# Patient Record
Sex: Female | Born: 1985 | Race: White | Hispanic: No | Marital: Married | Smoking: Never smoker
Health system: Southern US, Community
[De-identification: ages and names within clinical notes are randomized; demographics above are authoritative.]

## PROBLEM LIST (undated history)

## (undated) DIAGNOSIS — K5900 Constipation, unspecified: Secondary | ICD-10-CM

## (undated) DIAGNOSIS — G939 Disorder of brain, unspecified: Secondary | ICD-10-CM

## (undated) DIAGNOSIS — F419 Anxiety disorder, unspecified: Secondary | ICD-10-CM

## (undated) HISTORY — DX: Disorder of brain, unspecified: G93.9

## (undated) HISTORY — DX: Anxiety disorder, unspecified: F41.9

## (undated) HISTORY — DX: Constipation, unspecified: K59.00

---
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
## 2016-06-19 DIAGNOSIS — K5900 Constipation, unspecified: Secondary | ICD-10-CM | POA: Diagnosis not present

## 2016-07-25 ENCOUNTER — Ambulatory Visit (INDEPENDENT_AMBULATORY_CARE_PROVIDER_SITE_OTHER): Payer: BLUE CROSS/BLUE SHIELD | Admitting: Obstetrics and Gynecology

## 2016-07-25 ENCOUNTER — Encounter: Payer: Self-pay | Admitting: Obstetrics and Gynecology

## 2016-07-25 VITALS — BP 121/71 | HR 68 | Ht 63.0 in | Wt 143.6 lb

## 2016-07-25 DIAGNOSIS — F419 Anxiety disorder, unspecified: Secondary | ICD-10-CM | POA: Diagnosis not present

## 2016-07-25 DIAGNOSIS — Z8742 Personal history of other diseases of the female genital tract: Secondary | ICD-10-CM | POA: Diagnosis not present

## 2016-07-25 DIAGNOSIS — N943 Premenstrual tension syndrome: Secondary | ICD-10-CM | POA: Diagnosis not present

## 2016-07-25 DIAGNOSIS — Z01419 Encounter for gynecological examination (general) (routine) without abnormal findings: Secondary | ICD-10-CM | POA: Diagnosis not present

## 2016-07-25 MED ORDER — NORETHINDRONE-ETH ESTRADIOL 1-35 MG-MCG PO TABS: 1.0000 | ORAL_TABLET | Freq: Every day | ORAL | 4 refills | Status: DC

## 2016-07-25 NOTE — Patient Instructions (Signed)
1. Pap smear is done 2. Self breast awareness is encouraged 3. Healthy eating with exercise is encouraged 4. Birth control pill prescription is refilled 5. Continue with Wellbutrin for PMS 6. Return in 1 year for annual exam   Premenstrual Syndrome Premenstrual syndrome (PMS) is a condition that consists of physical, emotional, and behavioral symptoms that affect women of childbearing age. PMS occurs 5-14 days before the start of a menstrual period and often recurs in a predictable pattern. The symptoms go away a few days after the menstrual period starts. PMS can interfere in many ways with normal daily activities and can range from mild to severe. When PMS is considered severe, it may be diagnosed as premenstrual dysphoric disorder (PMDD). A small percentage of women are affected by PMS symptoms and an even smaller percentage of those women are affected by PMDD.  CAUSES  The exact cause of PMS is unknown, but it seems to be related to cyclic hormone changes that happen before menstruation. These hormones are thought to affect chemicals in the brain (serotonin) that can influence a person's mood.  SYMPTOMS  Symptoms of PMS recur consistently from month to month and go away completely after the menstrual period starts. The most common emotional or behavioral symptom is mood swings. These mood swings can be disabling and interfere with normal activities of daily living. Other common symptoms include depression and angry outbursts. Other symptoms may include:   Irritability.  Anxiety.  Crying spells.   Food cravings or appetite changes.   Changes in sexual desire.   Confusion.   Aggression.   Social withdrawal.   Poor concentration. The most common physical symptoms include a sense of bloating, breast pain, headaches, and extreme fatigue. Other physical symptoms include:   Backaches.   Swelling of the hands and feet.   Weight gain.   Hot flashes.  DIAGNOSIS  To make  a diagnosis, your caregiver will ask questions to confirm that you are having a pattern of symptoms. Symptoms must:   Be present 5 days before the start of your period and be present at least 3 months in a row.   End within 4 days after your period starts.   Interfere with some of your normal activities.  Other conditions, such as thyroid disease, depression, and migraine headaches must be ruled out before a diagnosis of PMS is confirmed.  TREATMENT  Your caregiver may suggest ways to maintain a healthy lifestyle, such as exercise. Over-the-counter pain relievers may ease cramps, aches, pains, headaches, and breast tenderness. However, selective serotonin reuptake inhibitors (SSRIs) are medicines that are most beneficial in improving PMS if taken in the second half of the monthly cycle. They may be taken on a daily basis. The most effective oral contraceptive pill used for symptoms of PMS is one that contains the ingredient drospirenone. Taking 4 days off of the pill instead of the usual 7 days also has shown to increase effectiveness.  There are a number of drugs, dietary supplements, vitamins, and water pills (diuretics) which have been suggested to be helpful but have not shown to be of any benefit to improving PMS symptoms.  HOME CARE INSTRUCTIONS   For 2-3 months, write down your symptoms, their severity, and how long they last. This may help your caregiver prescribe the best treatment for your symptoms.  Exercise regularly as suggested by your caregiver.  Eat a regular, well-balanced diet.  Avoid caffeine, alcohol, and tobacco consumption.  Limit salt and salty foods to lessen bloating  and fluid retention.  Get enough sleep. Practice relaxation techniques.  Drink enough fluids to keep your urine clear or pale yellow.  Take medicines as directed by your caregiver.  Limit stress.  Take a multivitamin as directed by your caregiver. This information is not intended to replace  advice given to you by your health care provider. Make sure you discuss any questions you have with your health care provider. Document Released: 07/28/2000 Document Revised: 04/24/2012 Document Reviewed: 04/30/2015 Elsevier Interactive Patient Education  2017 [employer] Maintenance, Female Introduction Adopting a healthy lifestyle and getting preventive care can go a long way to promote health and wellness. Talk with your health care provider about what schedule of regular examinations is right for you. This is a good chance for you to check in with your provider about disease prevention and staying healthy. In between checkups, there are plenty of things you can do on your own. Experts have done a lot of research about which lifestyle changes and preventive measures are most likely to keep you healthy. Ask your health care provider for more information. Weight and diet Eat a healthy diet  Be sure to include plenty of vegetables, fruits, low-fat dairy products, and lean protein.  Do not eat a lot of foods high in solid fats, added sugars, or salt.  Get regular exercise. This is one of the most important things you can do for your health.  Most adults should exercise for at least 150 minutes each week. The exercise should increase your heart rate and make you sweat (moderate-intensity exercise).  Most adults should also do strengthening exercises at least twice a week. This is in addition to the moderate-intensity exercise. Maintain a healthy weight  Body mass index (BMI) is a measurement that can be used to identify possible weight problems. It estimates body fat based on height and weight. Your health care provider can help determine your BMI and help you achieve or maintain a healthy weight.  For females 38 years of age and older:  A BMI below 18.5 is considered underweight.  A BMI of 18.5 to 24.9 is normal.  A BMI of 25 to 29.9 is considered overweight.  A BMI of 30 and  above is considered obese. Watch levels of cholesterol and blood lipids  You should start having your blood tested for lipids and cholesterol at 31 years of age, then have this test every 5 years.  You may need to have your cholesterol levels checked more often if:  Your lipid or cholesterol levels are high.  You are older than 31 years of age.  You are at high risk for heart disease. Cancer screening Lung Cancer  Lung cancer screening is recommended for adults 75-74 years old who are at high risk for lung cancer because of a history of smoking.  A yearly low-dose CT scan of the lungs is recommended for people who:  Currently smoke.  Have quit within the past 15 years.  Have at least a 30-pack-year history of smoking. A pack year is smoking an average of one pack of cigarettes a day for 1 year.  Yearly screening should continue until it has been 15 years since you quit.  Yearly screening should stop if you develop a health problem that would prevent you from having lung cancer treatment. Breast Cancer  Practice breast self-awareness. This means understanding how your breasts normally appear and feel.  It also means doing regular breast self-exams. Let your health care provider know  about any changes, no matter how small.  If you are in your 20s or 30s, you should have a clinical breast exam (CBE) by a health care provider every 1-3 years as part of a regular health exam.  If you are 29 or older, have a CBE every year. Also consider having a breast X-ray (mammogram) every year.  If you have a family history of breast cancer, talk to your health care provider about genetic screening.  If you are at high risk for breast cancer, talk to your health care provider about having an MRI and a mammogram every year.  Breast cancer gene (BRCA) assessment is recommended for women who have family members with BRCA-related cancers. BRCA-related cancers  include:  Breast.  Ovarian.  Tubal.  Peritoneal cancers.  Results of the assessment will determine the need for genetic counseling and BRCA1 and BRCA2 testing. Cervical Cancer  Your health care provider may recommend that you be screened regularly for cancer of the pelvic organs (ovaries, uterus, and vagina). This screening involves a pelvic examination, including checking for microscopic changes to the surface of your cervix (Pap test). You may be encouraged to have this screening done every 3 years, beginning at age 9.  For women ages 40-65, health care providers may recommend pelvic exams and Pap testing every 3 years, or they may recommend the Pap and pelvic exam, combined with testing for human papilloma virus (HPV), every 5 years. Some types of HPV increase your risk of cervical cancer. Testing for HPV may also be done on women of any age with unclear Pap test results.  Other health care providers may not recommend any screening for nonpregnant women who are considered low risk for pelvic cancer and who do not have symptoms. Ask your health care provider if a screening pelvic exam is right for you.  If you have had past treatment for cervical cancer or a condition that could lead to cancer, you need Pap tests and screening for cancer for at least 20 years after your treatment. If Pap tests have been discontinued, your risk factors (such as having a new sexual partner) need to be reassessed to determine if screening should resume. Some women have medical problems that increase the chance of getting cervical cancer. In these cases, your health care provider may recommend more frequent screening and Pap tests. Colorectal Cancer  This type of cancer can be detected and often prevented.  Routine colorectal cancer screening usually begins at 31 years of age and continues through 31 years of age.  Your health care provider may recommend screening at an earlier age if you have risk factors  for colon cancer.  Your health care provider may also recommend using home test kits to check for hidden blood in the stool.  A small camera at the end of a tube can be used to examine your colon directly (sigmoidoscopy or colonoscopy). This is done to check for the earliest forms of colorectal cancer.  Routine screening usually begins at age 66.  Direct examination of the colon should be repeated every 5-10 years through 31 years of age. However, you may need to be screened more often if early forms of precancerous polyps or small growths are found. Skin Cancer  Check your skin from head to toe regularly.  Tell your health care provider about any new moles or changes in moles, especially if there is a change in a mole's shape or color.  Also tell your health care provider if  you have a mole that is larger than the size of a pencil eraser.  Always use sunscreen. Apply sunscreen liberally and repeatedly throughout the day.  Protect yourself by wearing long sleeves, pants, a wide-brimmed hat, and sunglasses whenever you are outside. Heart disease, diabetes, and high blood pressure  High blood pressure causes heart disease and increases the risk of stroke. High blood pressure is more likely to develop in:  People who have blood pressure in the high end of the normal range (130-139/85-89 mm Hg).  People who are overweight or obese.  People who are African American.  If you are 82-71 years of age, have your blood pressure checked every 3-5 years. If you are 33 years of age or older, have your blood pressure checked every year. You should have your blood pressure measured twice-once when you are at a hospital or clinic, and once when you are not at a hospital or clinic. Record the average of the two measurements. To check your blood pressure when you are not at a hospital or clinic, you can use:  An automated blood pressure machine at a pharmacy.  A home blood pressure monitor.  If you  are between 58 years and 41 years old, ask your health care provider if you should take aspirin to prevent strokes.  Have regular diabetes screenings. This involves taking a blood sample to check your fasting blood sugar level.  If you are at a normal weight and have a low risk for diabetes, have this test once every three years after 31 years of age.  If you are overweight and have a high risk for diabetes, consider being tested at a younger age or more often. Preventing infection Hepatitis B  If you have a higher risk for hepatitis B, you should be screened for this virus. You are considered at high risk for hepatitis B if:  You were born in a country where hepatitis B is common. Ask your health care provider which countries are considered high risk.  Your parents were born in a high-risk country, and you have not been immunized against hepatitis B (hepatitis B vaccine).  You have HIV or AIDS.  You use needles to inject street drugs.  You live with someone who has hepatitis B.  You have had sex with someone who has hepatitis B.  You get hemodialysis treatment.  You take certain medicines for conditions, including cancer, organ transplantation, and autoimmune conditions. Hepatitis C  Blood testing is recommended for:  Everyone born from [birth year] through 1966.  Anyone with known risk factors for hepatitis C. Sexually transmitted infections (STIs)  You should be screened for sexually transmitted infections (STIs) including gonorrhea and chlamydia if:  You are sexually active and are younger than 31 years of age.  You are older than 31 years of age and your health care provider tells you that you are at risk for this type of infection.  Your sexual activity has changed since you were last screened and you are at an increased risk for chlamydia or gonorrhea. Ask your health care provider if you are at risk.  If you do not have HIV, but are at risk, it may be recommended that you  take a prescription medicine daily to prevent HIV infection. This is called pre-exposure prophylaxis (PrEP). You are considered at risk if:  You are sexually active and do not regularly use condoms or know the HIV status of your partner(s).  You take drugs by injection.  You  are sexually active with a partner who has HIV. Talk with your health care provider about whether you are at high risk of being infected with HIV. If you choose to begin PrEP, you should first be tested for HIV. You should then be tested every 3 months for as long as you are taking PrEP. Pregnancy  If you are premenopausal and you may become pregnant, ask your health care provider about preconception counseling.  If you may become pregnant, take 400 to 800 micrograms (mcg) of folic acid every day.  If you want to prevent pregnancy, talk to your health care provider about birth control (contraception). Osteoporosis and menopause  Osteoporosis is a disease in which the bones lose minerals and strength with aging. This can result in serious bone fractures. Your risk for osteoporosis can be identified using a bone density scan.  If you are 26 years of age or older, or if you are at risk for osteoporosis and fractures, ask your health care provider if you should be screened.  Ask your health care provider whether you should take a calcium or vitamin D supplement to lower your risk for osteoporosis.  Menopause may have certain physical symptoms and risks.  Hormone replacement therapy may reduce some of these symptoms and risks. Talk to your health care provider about whether hormone replacement therapy is right for you. Follow these instructions at home:  Schedule regular health, dental, and eye exams.  Stay current with your immunizations.  Do not use any tobacco products including cigarettes, chewing tobacco, or electronic cigarettes.  If you are pregnant, do not drink alcohol.  If you are breastfeeding, limit  how much and how often you drink alcohol.  Limit alcohol intake to no more than 1 drink per day for nonpregnant women. One drink equals 12 ounces of beer, 5 ounces of wine, or 1 ounces of hard liquor.  Do not use street drugs.  Do not share needles.  Ask your health care provider for help if you need support or information about quitting drugs.  Tell your health care provider if you often feel depressed.  Tell your health care provider if you have ever been abused or do not feel safe at home. This information is not intended to replace advice given to you by your health care provider. Make sure you discuss any questions you have with your health care provider. Document Released: 02/13/2011 Document Revised: 01/06/2016 Document Reviewed: 05/04/2015  2017 Elsevier

## 2016-07-25 NOTE — Progress Notes (Signed)
ANNUAL PREVENTATIVE CARE GYN  ENCOUNTER NOTE  Subjective:       Allison Huynh is a 30 y.o. G0P0000 female here for a routine annual gynecologic exam.  Current complaints: 1.little to   No cycle on ocp-   pms- really bad; Currently on bupropion with good control symptoms Remote history of severe dysmenorrhea as a teen; controlled with OCPs; currently on a 35 g pill for regulation of abnormal uterine bleeding   Gynecologic History Patient's last menstrual period was 07/23/2016 (exact date). Contraception: OCP (estrogen/progesterone) Last Pap: 2016 wnl. Results were: normal Last mammogram: n/a.  Menarche-age 79 Intervals-monthly Duration-5 days Dysmenorrhea-history of severe dysmenorrhea managed with ibuprofen or Aleve; resolved with OCP therapy History of PMS  Obstetric History OB History  Gravida Para Term Preterm AB Living  0 0 0 0 0 0  SAB TAB Ectopic Multiple Live Births  0 0 0 0 0        Past Medical History:  Diagnosis Date  . Anxiety   . Constipation     Past Surgical History:  Procedure Laterality Date  . APPENDECTOMY  2005    No current outpatient prescriptions on file prior to visit.   No current facility-administered medications on file prior to visit.     Allergies  Allergen Reactions  . Polysporin [Bacitracin-Polymyxin B]     Social History   Social History  . Marital status: Married    Spouse name: N/A  . Number of children: N/A  . Years of education: N/A   Occupational History  . Not on file.   Social History Main Topics  . Smoking status: Not on file  . Smokeless tobacco: Not on file  . Alcohol use Not on file  . Drug use: Unknown  . Sexual activity: Not on file   Other Topics Concern  . Not on file   Social History Narrative  . No narrative on file    No family history on file.  The following portions of the patient's history were reviewed and updated as appropriate: allergies, current medications, past family history, past  medical history, past social history, past surgical history and problem list.  Review of Systems ROS Review of Systems - General ROS: negative for - chills, fatigue, fever, hot flashes, night sweats, weight gain or weight loss Psychological ROS: negative for - anxiety, decreased libido, depression, mood swings, physical abuse or sexual abuse Ophthalmic ROS: negative for - blurry vision, eye pain or loss of vision ENT ROS: negative for - headaches, hearing change, visual changes or vocal changes Allergy and Immunology ROS: negative for - hives, itchy/watery eyes or seasonal allergies Hematological and Lymphatic ROS: negative for - bleeding problems, bruising, swollen lymph nodes or weight loss Endocrine ROS: negative for - galactorrhea, hair pattern changes, hot flashes, malaise/lethargy, mood swings, palpitations, polydipsia/polyuria, skin changes, temperature intolerance or unexpected weight changes Breast ROS: negative for - new or changing breast lumps or nipple discharge Respiratory ROS: negative for - cough or shortness of breath Cardiovascular ROS: negative for - chest pain, irregular heartbeat, palpitations or shortness of breath Gastrointestinal ROS: no abdominal pain, change in bowel habits, or black or bloody stools Genito-Urinary ROS: no dysuria, trouble voiding, or hematuria Musculoskeletal ROS: negative for - joint pain or joint stiffness Neurological ROS: negative for - bowel and bladder control changes Dermatological ROS: negative for rash and skin lesion changes   Objective:   BP 121/71   Pulse 68   Ht 5\' 3"  (1.6 m)   Wt  143 lb 9.6 oz (65.1 kg)   LMP 07/23/2016 (Exact Date)   BMI 25.44 kg/m  CONSTITUTIONAL: Well-developed, well-nourished female in no acute distress.  PSYCHIATRIC: Normal mood and affect. Normal behavior. Normal judgment and thought content. NEUROLGIC: Alert and oriented to person, place, and time. Normal muscle tone coordination. No cranial nerve deficit  noted. HENT:  Normocephalic, atraumatic, External right and left ear normal. Oropharynx is clear and moist EYES: Conjunctivae and EOM are normal. Pupils are equal, round, and reactive to light. No scleral icterus.  NECK: Normal range of motion, supple, no masses.  Normal thyroid.  SKIN: Skin is warm and dry. No rash noted. Not diaphoretic. No erythema. No pallor. CARDIOVASCULAR: Normal heart rate noted, regular rhythm, no murmur. RESPIRATORY: Clear to auscultation bilaterally. Effort and breath sounds normal, no problems with respiration noted. BREASTS: Symmetric in size. No masses, skin changes, nipple drainage, or lymphadenopathy. ABDOMEN: Soft, normal bowel sounds, no distention noted.  No tenderness, rebound or guarding.  BLADDER: Normal PELVIC:  External Genitalia: Normal  BUS: Normal  Vagina: Normal  Cervix: Normal; no lesions; no cervical motion tenderness  Uterus: Normal; midplane, normal size and shape, mobile, nontender  Adnexa: Normal; nonpalpable and nontender  RV: External Exam NormaI  MUSCULOSKELETAL: Normal range of motion. No tenderness.  No cyanosis, clubbing, or edema.  2+ distal pulses. LYMPHATIC: No Axillary, Supraclavicular, or Inguinal Adenopathy.    Assessment:   Annual gynecologic examination 30 y.o. Contraception: OCP (estrogen/progesterone) Normal BMI History of dysmenorrhea PMS Plan:  Pap: Pap Co Test Mammogram: Not Indicated Stool Guaiac Testing:  Not Indicated Labs: declines Routine preventative health maintenance measures emphasized: Exercise/Diet/Weight control, Tobacco Warnings and Alcohol/Substance use risks Birth control pill prescription refilled Continue with bupropion for PMS Return to Clinic - 1 Year   Crystal BrookfieldMiller, CMA  Herold HarmsMartin A Brandon Wiechman, MD  Note: This dictation was prepared with Dragon dictation along with smaller phrase technology. Any transcriptional errors that result from this process are unintentional.

## 2016-07-26 DIAGNOSIS — Z01419 Encounter for gynecological examination (general) (routine) without abnormal findings: Secondary | ICD-10-CM | POA: Diagnosis not present

## 2016-07-29 LAB — PAP IG AND HPV HIGH-RISK
HPV, HIGH-RISK: NEGATIVE
PAP Smear Comment: 0

## 2016-07-30 DIAGNOSIS — F4320 Adjustment disorder, unspecified: Secondary | ICD-10-CM | POA: Diagnosis not present

## 2016-08-16 DIAGNOSIS — F4320 Adjustment disorder, unspecified: Secondary | ICD-10-CM | POA: Diagnosis not present

## 2016-09-30 DIAGNOSIS — B373 Candidiasis of vulva and vagina: Secondary | ICD-10-CM | POA: Diagnosis not present

## 2016-09-30 DIAGNOSIS — N3001 Acute cystitis with hematuria: Secondary | ICD-10-CM | POA: Diagnosis not present

## 2016-10-17 DIAGNOSIS — M9902 Segmental and somatic dysfunction of thoracic region: Secondary | ICD-10-CM | POA: Diagnosis not present

## 2016-10-17 DIAGNOSIS — M5408 Panniculitis affecting regions of neck and back, sacral and sacrococcygeal region: Secondary | ICD-10-CM | POA: Diagnosis not present

## 2016-10-17 DIAGNOSIS — M9901 Segmental and somatic dysfunction of cervical region: Secondary | ICD-10-CM | POA: Diagnosis not present

## 2016-10-17 DIAGNOSIS — M62838 Other muscle spasm: Secondary | ICD-10-CM | POA: Diagnosis not present

## 2016-10-18 ENCOUNTER — Other Ambulatory Visit: Payer: Self-pay | Admitting: Obstetrics and Gynecology

## 2016-11-03 DIAGNOSIS — F4322 Adjustment disorder with anxiety: Secondary | ICD-10-CM | POA: Diagnosis not present

## 2016-11-08 DIAGNOSIS — F4322 Adjustment disorder with anxiety: Secondary | ICD-10-CM | POA: Diagnosis not present

## 2016-11-10 DIAGNOSIS — F4322 Adjustment disorder with anxiety: Secondary | ICD-10-CM | POA: Diagnosis not present

## 2016-11-24 DIAGNOSIS — F4322 Adjustment disorder with anxiety: Secondary | ICD-10-CM | POA: Diagnosis not present

## 2016-11-29 DIAGNOSIS — F4322 Adjustment disorder with anxiety: Secondary | ICD-10-CM | POA: Diagnosis not present

## 2016-11-30 DIAGNOSIS — M9902 Segmental and somatic dysfunction of thoracic region: Secondary | ICD-10-CM | POA: Diagnosis not present

## 2016-11-30 DIAGNOSIS — M62838 Other muscle spasm: Secondary | ICD-10-CM | POA: Diagnosis not present

## 2016-11-30 DIAGNOSIS — M9901 Segmental and somatic dysfunction of cervical region: Secondary | ICD-10-CM | POA: Diagnosis not present

## 2016-11-30 DIAGNOSIS — M5408 Panniculitis affecting regions of neck and back, sacral and sacrococcygeal region: Secondary | ICD-10-CM | POA: Diagnosis not present

## 2016-12-08 DIAGNOSIS — F4322 Adjustment disorder with anxiety: Secondary | ICD-10-CM | POA: Diagnosis not present

## 2016-12-13 DIAGNOSIS — M5408 Panniculitis affecting regions of neck and back, sacral and sacrococcygeal region: Secondary | ICD-10-CM | POA: Diagnosis not present

## 2016-12-13 DIAGNOSIS — M62838 Other muscle spasm: Secondary | ICD-10-CM | POA: Diagnosis not present

## 2016-12-13 DIAGNOSIS — M9902 Segmental and somatic dysfunction of thoracic region: Secondary | ICD-10-CM | POA: Diagnosis not present

## 2016-12-13 DIAGNOSIS — M9901 Segmental and somatic dysfunction of cervical region: Secondary | ICD-10-CM | POA: Diagnosis not present

## 2017-02-02 DIAGNOSIS — M9901 Segmental and somatic dysfunction of cervical region: Secondary | ICD-10-CM | POA: Diagnosis not present

## 2017-02-02 DIAGNOSIS — M5408 Panniculitis affecting regions of neck and back, sacral and sacrococcygeal region: Secondary | ICD-10-CM | POA: Diagnosis not present

## 2017-02-02 DIAGNOSIS — M9902 Segmental and somatic dysfunction of thoracic region: Secondary | ICD-10-CM | POA: Diagnosis not present

## 2017-02-02 DIAGNOSIS — M62838 Other muscle spasm: Secondary | ICD-10-CM | POA: Diagnosis not present

## 2017-03-20 DIAGNOSIS — M9901 Segmental and somatic dysfunction of cervical region: Secondary | ICD-10-CM | POA: Diagnosis not present

## 2017-03-20 DIAGNOSIS — M5408 Panniculitis affecting regions of neck and back, sacral and sacrococcygeal region: Secondary | ICD-10-CM | POA: Diagnosis not present

## 2017-03-20 DIAGNOSIS — M62838 Other muscle spasm: Secondary | ICD-10-CM | POA: Diagnosis not present

## 2017-03-20 DIAGNOSIS — G4489 Other headache syndrome: Secondary | ICD-10-CM | POA: Diagnosis not present

## 2017-04-27 ENCOUNTER — Ambulatory Visit: Payer: BLUE CROSS/BLUE SHIELD | Admitting: Family Medicine

## 2017-05-04 ENCOUNTER — Ambulatory Visit (INDEPENDENT_AMBULATORY_CARE_PROVIDER_SITE_OTHER): Payer: BLUE CROSS/BLUE SHIELD | Admitting: Family Medicine

## 2017-05-04 ENCOUNTER — Encounter: Payer: Self-pay | Admitting: Family Medicine

## 2017-05-04 VITALS — BP 102/60 | HR 60 | Temp 97.6°F | Resp 16 | Ht 63.0 in | Wt 149.0 lb

## 2017-05-04 DIAGNOSIS — Z7689 Persons encountering health services in other specified circumstances: Secondary | ICD-10-CM

## 2017-05-04 DIAGNOSIS — F419 Anxiety disorder, unspecified: Secondary | ICD-10-CM | POA: Diagnosis not present

## 2017-05-04 DIAGNOSIS — Z808 Family history of malignant neoplasm of other organs or systems: Secondary | ICD-10-CM | POA: Diagnosis not present

## 2017-05-04 DIAGNOSIS — K5909 Other constipation: Secondary | ICD-10-CM | POA: Insufficient documentation

## 2017-05-04 DIAGNOSIS — R1031 Right lower quadrant pain: Secondary | ICD-10-CM | POA: Diagnosis not present

## 2017-05-04 DIAGNOSIS — Z0000 Encounter for general adult medical examination without abnormal findings: Secondary | ICD-10-CM | POA: Diagnosis not present

## 2017-05-04 DIAGNOSIS — G939 Disorder of brain, unspecified: Secondary | ICD-10-CM

## 2017-05-04 DIAGNOSIS — Z114 Encounter for screening for human immunodeficiency virus [HIV]: Secondary | ICD-10-CM | POA: Diagnosis not present

## 2017-05-04 DIAGNOSIS — N912 Amenorrhea, unspecified: Secondary | ICD-10-CM | POA: Diagnosis not present

## 2017-05-04 LAB — CBC WITH DIFFERENTIAL/PLATELET
BASOS ABS: 88 {cells}/uL (ref 0–200)
Basophils Relative: 1.4 %
EOS ABS: 69 {cells}/uL (ref 15–500)
EOS PCT: 1.1 %
HEMATOCRIT: 38.8 % (ref 35.0–45.0)
HEMOGLOBIN: 13.2 g/dL (ref 11.7–15.5)
LYMPHS ABS: 2577 {cells}/uL (ref 850–3900)
MCH: 30.4 pg (ref 27.0–33.0)
MCHC: 34 g/dL (ref 32.0–36.0)
MCV: 89.4 fL (ref 80.0–100.0)
MPV: 10.3 fL (ref 7.5–12.5)
Monocytes Relative: 7.1 %
NEUTROS ABS: 3119 {cells}/uL (ref 1500–7800)
Neutrophils Relative %: 49.5 %
Platelets: 292 10*3/uL (ref 140–400)
RBC: 4.34 10*6/uL (ref 3.80–5.10)
RDW: 11.1 % (ref 11.0–15.0)
Total Lymphocyte: 40.9 %
WBC: 6.3 10*3/uL (ref 3.8–10.8)
WBCMIX: 447 {cells}/uL (ref 200–950)

## 2017-05-04 LAB — COMPLETE METABOLIC PANEL WITH GFR
AG Ratio: 1.2 (calc) (ref 1.0–2.5)
ALBUMIN MSPROF: 3.9 g/dL (ref 3.6–5.1)
ALT: 28 U/L (ref 6–29)
AST: 22 U/L (ref 10–30)
Alkaline phosphatase (APISO): 48 U/L (ref 33–115)
BILIRUBIN TOTAL: 0.4 mg/dL (ref 0.2–1.2)
BUN: 10 mg/dL (ref 7–25)
CHLORIDE: 105 mmol/L (ref 98–110)
CO2: 26 mmol/L (ref 20–32)
CREATININE: 0.78 mg/dL (ref 0.50–1.10)
Calcium: 8.6 mg/dL (ref 8.6–10.2)
GFR, EST AFRICAN AMERICAN: 117 mL/min/{1.73_m2} (ref >=60)
GFR, Est Non African American: 101 mL/min/{1.73_m2} (ref >=60)
GLUCOSE: 89 mg/dL (ref 65–99)
Globulin: 3.2 g/dL (calc) (ref 1.9–3.7)
Potassium: 4.1 mmol/L (ref 3.5–5.3)
Sodium: 137 mmol/L (ref 135–146)
TOTAL PROTEIN: 7.1 g/dL (ref 6.1–8.1)

## 2017-05-04 LAB — TSH: TSH: 0.84 mIU/L

## 2017-05-04 NOTE — Assessment & Plan Note (Signed)
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

## 2017-05-04 NOTE — Assessment & Plan Note (Signed)
Well controlled Followed by GI Continue current meds

## 2017-05-04 NOTE — Progress Notes (Signed)
Patient: Allison Huynh, Female    DOB: 1985-09-12, 31 y.o.   MRN: [id number] Visit Date: 05/04/2017  Today's Provider: Shirlee Latch, MD   Chief Complaint  Patient presents with  . Establish Care  . Annual Exam   Subjective:    Annual physical exam Allison Huynh is a 31 y.o. female who presents today for health maintenance and complete physical. She feels well. She reports exercising typically 4 days per week for 1 hour. She bikes, runs, swims. Admits this has been decreased lately. She reports she is sleeping fairly well. She states she wakes up during the night about 2-3 times per night. No difficulty falling back asleep. She states she takes Melatonin, with relief.   She sees [facility] for gynecology. Last pap was 07/26/2016; was NIL and HPV negative. [place] the [place]. She states her last Tdap was May, 2017. Declines her flu vaccine.  She states she has RLQ pain yesterday now resolved. GYN informed her this may be a hernia. She is also c/o increased urinary urgency in the last 2-3 months. LMP was 03/29/2017. She states her menses are fairly regular on OCP's with a 4 week cycle. She is not concerned about pregnancy. Usually has periods once monthly on OCP. Periods were irregular before starting OCPs many years ago.  Has h/o ovarian cysts. -----------------------------------------------------------------  Had episode of Bell's Palsy - got MRI and lesions were seen. No bands on LP.  Was seen by MS specialist and told no medications needed right now. Also told that she may develop MS in the future.  Reports episodic numbness of R side of cranium in high stress times.  Chronic constipation: f/b GI.  Taking amitiza, colace and miralax.  Feels well controlled currently.  Most recent colonoscopy normal.  Has fam hx UC.  Review of Systems  Constitutional: Negative.   HENT: Negative.   Eyes: Negative.   Respiratory: Negative.     Cardiovascular: Negative.   Gastrointestinal: Positive for constipation (F/B GI). Negative for abdominal distention, abdominal pain, anal bleeding, blood in stool, diarrhea, nausea, rectal pain and vomiting.  Endocrine: Negative.   Genitourinary: Positive for urgency. Negative for decreased urine volume, difficulty urinating, dyspareunia, dysuria, enuresis, flank pain, frequency, genital sores, hematuria, menstrual problem, pelvic pain, vaginal bleeding, vaginal discharge and vaginal pain.  Musculoskeletal: Negative.   Skin: Negative.   Allergic/Immunologic: Negative.   Neurological: Negative.   Hematological: Negative.   Psychiatric/Behavioral: Negative.     Social History      She  reports that she has never smoked. She has never used smokeless tobacco. She reports that she drinks alcohol. She reports that she does not use drugs.       Social History   Social History  . Marital status: Married    Spouse name: Selena Batten  . Number of children: 0  . Years of education: Master's   Occupational History  . data analyst [employer]   Social History Main Topics  . Smoking status: Never Smoker  . Smokeless tobacco: Never Used  . Alcohol use Yes     Comment: 3 q week- beer/wine  . Drug use: No  . Sexual activity: Yes    Partners: Male    Birth control/ protection: Pill   Other Topics Concern  . None   Social History Narrative  . None    Past Medical History:  Diagnosis Date  . Anxiety   . Brain lesion    ?possible early  MS  . Constipation      Patient Active Problem List   Diagnosis Date Noted  . Chronic constipation 05/04/2017  . 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 Anxiety 07/25/2016  . History of dysmenorrhea 07/25/2016  . PMS (premenstrual syndrome) 07/25/2016    Past Surgical History:  Procedure Laterality Date  . LAPAROSCOPIC APPENDECTOMY  2005    Family History        Family Status  Relation Status  . Mother Alive  . Father Alive  . Sister Alive  . PGF (Not  Specified)  . Neg Hx (Not Specified)        Her family history includes Bladder Cancer (age of onset: 62) in her paternal grandfather; Diabetes in her father; GER disease in her mother; Healthy in her sister; Ulcerative colitis (age of onset: 12) in her mother.     Allergies  Allergen Reactions  . Polysporin [Bacitracin-Polymyxin B]   . Codeine Rash     Current Outpatient Prescriptions:  .  AMITIZA 24 MCG capsule, , Disp: , Rfl:  .  busPIRone (BUSPAR) 7.5 MG tablet, Take 7.5 mg by mouth 2 (two) times daily., Disp: , Rfl: 2 .  docusate sodium (COLACE) 250 MG capsule, Take 500 mg by mouth daily., Disp: , Rfl:  .  loratadine (CLARITIN) 10 MG tablet, Take 10 mg by mouth daily., Disp: , Rfl:  .  Multiple Vitamin (MULTIVITAMIN) tablet, Take 1 tablet by mouth daily., Disp: , Rfl:  .  norethindrone-ethinyl estradiol 1/35 (PIRMELLA 1/35) tablet, Take 1 tablet by mouth daily., Disp: 3 Package, Rfl: 4 .  Omega-3 Fatty Acids (FISH OIL PO), Take by mouth., Disp: , Rfl:  .  polyethylene glycol powder (GLYCOLAX/MIRALAX) powder, Take 17 g by mouth daily., Disp: , Rfl:    Patient Care Team: Erasmo Downer, MD as PCP - General (Family Medicine)      Objective:   Vitals: BP 102/60 (BP Location: Left Arm, Patient Position: Sitting, Cuff Size: Normal)   Pulse 60   Temp 97.6 F (36.4 C) (Oral)   Resp 16   Ht  (1.6 m)   Wt 149 lb (67.6 kg)   LMP 03/29/2017   BMI 26.39 kg/m    Vitals:   05/04/17 1550  BP: 102/60  Pulse: 60  Resp: 16  Temp: 97.6 F (36.4 C)  TempSrc: Oral  Weight: 149 lb (67.6 kg)  Height:  (1.6 m)     Physical Exam  Constitutional: She is oriented to person, place, and time. She appears well-developed and well-nourished. No distress.  HENT:  Head: Normocephalic and atraumatic.  Right Ear: External ear normal.  Left Ear: External ear normal.  Nose: Nose normal.  Mouth/Throat: Oropharynx is clear and moist.  Eyes: Pupils are equal, round, and reactive  to light. Conjunctivae and EOM are normal. No scleral icterus.  Neck: Neck supple. No thyromegaly present.  Cardiovascular: Normal rate, regular rhythm, normal heart sounds and intact distal pulses.   No murmur heard. Pulmonary/Chest: Effort normal and breath sounds normal. No respiratory distress. She has no wheezes. She has no rales.  Abdominal: Soft. Bowel sounds are normal. She exhibits no distension. There is no tenderness. There is no rebound and no guarding.  Musculoskeletal: She exhibits no edema or deformity.  Lymphadenopathy:    She has no cervical adenopathy.  Neurological: She is alert and oriented to person, place, and time. No cranial nerve deficit.  Skin: Skin is warm and dry. No rash noted.  Psychiatric:  She has a normal mood and affect. Her behavior is normal.  Vitals reviewed.    Depression Screen PHQ 2/9 Scores 05/04/2017  PHQ - 2 Score 0     Assessment & Plan:     Routine Health Maintenance and Physical Exam  Exercise Activities and Dietary recommendations Goals    None       There is no immunization history on file for this patient.  Health Maintenance  Topic Date Due  . HIV Screening  07/11/2000  . TETANUS/TDAP  07/11/2004  . INFLUENZA VACCINE  11/11/2017 (Originally 03/14/2017)  . PAP SMEAR  07/27/2019     Discussed health benefits of physical activity, and encouraged her to engage in regular exercise appropriate for her age and condition.    -------------------------------------------------------------------- Problem List Items Addressed This Visit      Digestive   Chronic constipation    Well controlled Followed by GI Continue current meds      Relevant Medications   docusate sodium (COLACE) 250 MG capsule   polyethylene glycol powder (GLYCOLAX/MIRALAX) powder   Other Relevant Orders   TSH     Other   Anxiety    Well-controlled Continue BuSpar      Lesion of brain    odd reported history of brain lesions that were not  treated Episodic numbness Concern for possible MS Request records from previous neurologist and consider neurology referral      RLQ abdominal pain    Benign abdominal exam today Patient is status post appendectomy Could be related to her chronic constipation as it is intermittent in nature Could also be related to her history of ovarian cysts No hernia detected today Return precautions discussed Urine pregnancy negative      Family history of skin cancer    Referral to dermatology for annual skin exam given family history of skin cancer      Relevant Orders   Ambulatory referral to Dermatology   RESOLVED: Amenorrhea   Relevant Orders   TSH    Other Visit Diagnoses    Healthcare maintenance    -  Primary   Relevant Orders   CBC w/Diff/Platelet   Comprehensive metabolic panel   TSH   Ambulatory referral to Dermatology   Encounter to establish care       Screening for HIV (human immunodeficiency virus)       Relevant Orders   HIV antibody (with reflex)      Return in about 1 year (around 05/04/2018) for CPE.  The entirety of the information documented in the History of Present Illness, Review of Systems and Physical Exam were personally obtained by me. Portions of this information were initially documented by Irving Burton Ratchford, CMA and reviewed by me for thoroughness and accuracy.     Shirlee Latch, MD  [facility]

## 2017-05-04 NOTE — Assessment & Plan Note (Signed)
Well-controlled. Continue BuSpar. 

## 2017-05-04 NOTE — Assessment & Plan Note (Signed)
Referral to dermatology for annual skin exam given family history of skin cancer

## 2017-05-04 NOTE — Patient Instructions (Signed)
Preventive Care 18-39 Years, Female Preventive care refers to lifestyle choices and visits with your health care provider that can promote health and wellness. What does preventive care include?  A yearly physical exam. This is also called an annual well check.  Dental exams once or twice a year.  Routine eye exams. Ask your health care provider how often you should have your eyes checked.  Personal lifestyle choices, including: ? Daily care of your teeth and gums. ? Regular physical activity. ? Eating a healthy diet. ? Avoiding tobacco and drug use. ? Limiting alcohol use. ? Practicing safe sex. ? Taking vitamin and mineral supplements as recommended by your health care provider. What happens during an annual well check? The services and screenings done by your health care provider during your annual well check will depend on your age, overall health, lifestyle risk factors, and family history of disease. Counseling Your health care provider may ask you questions about your:  Alcohol use.  Tobacco use.  Drug use.  Emotional well-being.  Home and relationship well-being.  Sexual activity.  Eating habits.  Work and work Statistician.  Method of birth control.  Menstrual cycle.  Pregnancy history.  Screening You may have the following tests or measurements:  Height, weight, and BMI.  Diabetes screening. This is done by checking your blood sugar (glucose) after you have not eaten for a while (fasting).  Blood pressure.  Lipid and cholesterol levels. These may be checked every 5 years starting at age 66.  Skin check.  Hepatitis C blood test.  Hepatitis B blood test.  Sexually transmitted disease (STD) testing.  BRCA-related cancer screening. This may be done if you have a family history of breast, ovarian, tubal, or peritoneal cancers.  Pelvic exam and Pap test. This may be done every 3 years starting at age 40. Starting at age 59, this may be done every 5  years if you have a Pap test in combination with an HPV test.  Discuss your test results, treatment options, and if necessary, the need for more tests with your health care provider. Vaccines Your health care provider may recommend certain vaccines, such as:  Influenza vaccine. This is recommended every year.  Tetanus, diphtheria, and acellular pertussis (Tdap, Td) vaccine. You may need a Td booster every 10 years.  Varicella vaccine. You may need this if you have not been vaccinated.  HPV vaccine. If you are 69 or younger, you may need three doses over 6 months.  Measles, mumps, and rubella (MMR) vaccine. You may need at least one dose of MMR. You may also need a second dose.  Pneumococcal 13-valent conjugate (PCV13) vaccine. You may need this if you have certain conditions and were not previously vaccinated.  Pneumococcal polysaccharide (PPSV23) vaccine. You may need one or two doses if you smoke cigarettes or if you have certain conditions.  Meningococcal vaccine. One dose is recommended if you are age 27-21 years and a first-year college student living in a residence hall, or if you have one of several medical conditions. You may also need additional booster doses.  Hepatitis A vaccine. You may need this if you have certain conditions or if you travel or work in places where you may be exposed to hepatitis A.  Hepatitis B vaccine. You may need this if you have certain conditions or if you travel or work in places where you may be exposed to hepatitis B.  Haemophilus influenzae type b (Hib) vaccine. You may need this if  you have certain risk factors.  Talk to your health care provider about which screenings and vaccines you need and how often you need them. This information is not intended to replace advice given to you by your health care provider. Make sure you discuss any questions you have with your health care provider. Document Released: 09/26/2001 Document Revised: 04/19/2016  Document Reviewed: 06/01/2015 Elsevier Interactive Patient Education  2017 [employer].

## 2017-05-04 NOTE — Assessment & Plan Note (Signed)
Benign abdominal exam today Patient is status post appendectomy Could be related to her chronic constipation as it is intermittent in nature Could also be related to her history of ovarian cysts No hernia detected today Return precautions discussed Urine pregnancy negative

## 2017-05-05 LAB — HIV ANTIBODY (ROUTINE TESTING W REFLEX): HIV 1&2 Ab, 4th Generation: NONREACTIVE

## 2017-05-07 ENCOUNTER — Telehealth: Payer: Self-pay

## 2017-05-07 LAB — POCT URINE PREGNANCY: PREG TEST UR: NEGATIVE

## 2017-05-07 NOTE — Addendum Note (Signed)
Addended by: Tobi Bastos on: 05/07/2017 08:02 AM   Modules accepted: Orders

## 2017-05-07 NOTE — Telephone Encounter (Signed)
Pt advised.

## 2017-05-07 NOTE — Telephone Encounter (Signed)
-----   Message from Erasmo Downer, MD sent at 05/07/2017  8:26 AM EDT ----- Normal labs - negative HIV screening, normal Blood counts, kidney function, liver function, electrolytes, Thyroid function.  Erasmo Downer, MD, MPH [facility] 05/07/2017 8:26 AM

## 2017-05-18 DIAGNOSIS — M62838 Other muscle spasm: Secondary | ICD-10-CM | POA: Diagnosis not present

## 2017-05-18 DIAGNOSIS — M531 Cervicobrachial syndrome: Secondary | ICD-10-CM | POA: Diagnosis not present

## 2017-05-18 DIAGNOSIS — G4489 Other headache syndrome: Secondary | ICD-10-CM | POA: Diagnosis not present

## 2017-05-18 DIAGNOSIS — M9901 Segmental and somatic dysfunction of cervical region: Secondary | ICD-10-CM | POA: Diagnosis not present

## 2017-05-29 ENCOUNTER — Encounter: Payer: Self-pay | Admitting: Family Medicine

## 2017-06-21 ENCOUNTER — Other Ambulatory Visit: Payer: Self-pay | Admitting: Obstetrics and Gynecology

## 2017-06-21 ENCOUNTER — Encounter: Payer: Self-pay | Admitting: Family Medicine

## 2017-07-13 NOTE — Progress Notes (Signed)
ANNUAL PREVENTATIVE CARE GYN  ENCOUNTER NOTE  Subjective:       Allison Huynh is a 31 y.o. G0P0000 female here for a routine annual gynecologic exam.  Current complaints: 1.little to   No cycle on ocp-patient is taking the pills in a cyclic manner. History of Pms- really bad; Currently on bupropion with good control symptoms. Remote history of severe dysmenorrhea as a teen; controlled with OCPs; currently on a 35 g pill for regulation of abnormal uterine bleeding  2. Right lower pelvic pain x 3 months; patient was evaluated by primary care with subsequent resolution of pain; impression was probable ovarian cyst; no current symptoms at this time.  3.  History of chronic constipation, on laxatives with bowel frequency every other day.   Gynecologic History lmp-06/21/2017 Contraception: OCP (estrogen/progesterone) Last Pap: 07/26/2016 neg/neg Results were: normal Last mammogram: n/a.  Menarche-age 4 Intervals-monthly Duration-5 days Dysmenorrhea-history of severe dysmenorrhea managed with ibuprofen or Aleve; resolved with OCP therapy History of PMS  Obstetric History OB History  Gravida Para Term Preterm AB Living  0 0 0 0 0 0  SAB TAB Ectopic Multiple Live Births  0 0 0 0 0        Past Medical History:  Diagnosis Date  . Anxiety   . 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 Constipation     Past Surgical History:  Procedure Laterality Date  . LAPAROSCOPIC APPENDECTOMY  2005  /  Current Outpatient Medications on File Prior to Visit  Medication Sig Dispense Refill  . ALAYCEN 1/35 tablet TAKE 1 TABLET BY MOUTH DAILY. 30 tablet 0  . AMITIZA 24 MCG capsule     . busPIRone (BUSPAR) 7.5 MG tablet Take 7.5 mg by mouth 2 (two) times daily.  2  . docusate sodium (COLACE) 250 MG capsule Take 500 mg by mouth daily.    Marland Kitchen. loratadine (CLARITIN) 10 MG tablet Take 10 mg by mouth daily.    . Multiple Vitamin (MULTIVITAMIN) tablet Take 1 tablet by mouth daily.    . Omega-3 Fatty Acids  (FISH OIL PO) Take by mouth.    . polyethylene glycol powder (GLYCOLAX/MIRALAX) powder Take 17 g by mouth daily.     No current facility-administered medications on file prior to visit.     Allergies  Allergen Reactions  . Polysporin [Bacitracin-Polymyxin B]   . Codeine Rash    Social History   Socioeconomic History  . Marital status: Married    Spouse name: Selena BattenCody  . Number of children: 0  . Years of education: Master's  . Highest education level: Not on file  Social Needs  . Financial resource strain: Not on file  . Food insecurity - worry: Not on file  . Food insecurity - inability: Not on file  . Transportation needs - medical: Not on file  . Transportation needs - non-medical: Not on file  Occupational History  . Occupation: Programmer, multimediadata analyst    Employer: [employer]  Tobacco Use  . Smoking status: Never Smoker  . Smokeless tobacco: Never Used  Substance and Sexual Activity  . Alcohol use: Yes    Comment: 3 q week- beer/wine  . Drug use: No  . Sexual activity: Yes    Partners: Male    Birth control/protection: Pill  Other Topics Concern  . Not on file  Social History Narrative  . Not on file    Family History  Problem Relation Age of Onset  . GER disease Mother   .  Ulcerative colitis Mother [id number]  . Diabetes Father   . Healthy Sister   . Bladder Cancer Paternal Grandfather 70       mets to colon, +smoker  . Skin cancer Maternal Aunt        lots of other maternal family members with skin cancer as well  . Breast cancer Neg Hx   . Ovarian cancer Neg Hx   . Heart disease Neg Hx     The following portions of the patient's history were reviewed and updated as appropriate: allergies, current medications, past family history, past medical history, past social history, past surgical history and problem list.  Review of Systems Review of Systems  Constitutional: Negative.   HENT: Negative.   Eyes: Negative.   Respiratory: Negative.   Cardiovascular: Negative.    Gastrointestinal:       Chronic constipation controlled with laxatives  Genitourinary: Negative.        Amenorrhea on OCPs  Musculoskeletal: Negative.   Skin: Negative.   Neurological: Negative.   Endo/Heme/Allergies: Negative.   Psychiatric/Behavioral:       PMS, controlled with Wellbutrin     Objective:   BP 109/73   Pulse (!) 54   Ht 5\' 3"  (1.6 m)   Wt 144 lb 14.4 oz (65.7 kg)   LMP 06/21/2017 (Exact Date)   BMI 25.67 kg/m  CONSTITUTIONAL: Well-developed, well-nourished female in no acute distress.  PSYCHIATRIC: Normal mood and affect. Normal behavior. Normal judgment and thought content. NEUROLGIC: Alert and oriented to person, place, and time. Normal muscle tone coordination. No cranial nerve deficit noted. HENT:  Normocephalic, atraumatic, External right and left ear normal. Oropharynx is clear and moist EYES: Conjunctivae and EOM are normal.  No scleral icterus.  NECK: Normal range of motion, supple, no masses.  Normal thyroid.  SKIN: Skin is warm and dry. No rash noted. Not diaphoretic. No erythema. No pallor. CARDIOVASCULAR: Normal heart rate noted, regular rhythm, no murmur. RESPIRATORY: Clear to auscultation bilaterally. Effort and breath sounds normal, no problems with respiration noted. BREASTS: Symmetric in size. No masses, skin changes, nipple drainage, or lymphadenopathy. ABDOMEN: Soft, normal bowel sounds, no distention noted.  No tenderness, rebound or guarding.  BLADDER: Normal PELVIC:  External Genitalia: Normal  BUS: Normal  Vagina: Normal; no discharge  Cervix: Normal; no lesions; no cervical motion tenderness; large eversion  Uterus: Normal; midplane, normal size and shape, mobile, nontender  Adnexa: Normal; nonpalpable and nontender  RV: External Exam NormaI  MUSCULOSKELETAL: Normal range of motion. No tenderness.  No cyanosis, clubbing, or edema.  2+ distal pulses. LYMPHATIC: No Axillary, Supraclavicular, or Inguinal Adenopathy.    Assessment:    Annual gynecologic examination 31 y.o. Contraception: OCP (estrogen/progesterone) Normal BMI History of dysmenorrhea PMS Chronic constipation, stable with medications Plan:  Pap:Due 2020 Mammogram: Not Indicated Stool Guaiac Testing:  Not Indicated Labs: declines Routine preventative health maintenance measures emphasized: Exercise/Diet/Weight control, Tobacco Warnings and Alcohol/Substance use risks Birth control pill prescription refilled Continue with bupropion for PMS Return to Clinic - 1 Year   Crystal BethanyMiller, CMA  Herold HarmsMartin A Khristian Seals, MD  Note: This dictation was prepared with Dragon dictation along with smaller phrase technology. Any transcriptional errors that result from this process are unintentional.

## 2017-07-17 ENCOUNTER — Encounter: Payer: BLUE CROSS/BLUE SHIELD | Admitting: Obstetrics and Gynecology

## 2017-07-18 ENCOUNTER — Encounter: Payer: Self-pay | Admitting: Obstetrics and Gynecology

## 2017-07-18 ENCOUNTER — Ambulatory Visit (INDEPENDENT_AMBULATORY_CARE_PROVIDER_SITE_OTHER): Payer: BLUE CROSS/BLUE SHIELD | Admitting: Obstetrics and Gynecology

## 2017-07-18 VITALS — BP 109/73 | HR 54 | Ht 63.0 in | Wt 144.9 lb

## 2017-07-18 DIAGNOSIS — Z3041 Encounter for surveillance of contraceptive pills: Secondary | ICD-10-CM | POA: Diagnosis not present

## 2017-07-18 DIAGNOSIS — F419 Anxiety disorder, unspecified: Secondary | ICD-10-CM | POA: Diagnosis not present

## 2017-07-18 DIAGNOSIS — Z01419 Encounter for gynecological examination (general) (routine) without abnormal findings: Secondary | ICD-10-CM

## 2017-07-18 DIAGNOSIS — Z8742 Personal history of other diseases of the female genital tract: Secondary | ICD-10-CM | POA: Diagnosis not present

## 2017-07-18 DIAGNOSIS — N943 Premenstrual tension syndrome: Secondary | ICD-10-CM

## 2017-07-18 MED ORDER — NORETHINDRONE-ETH ESTRADIOL 1-35 MG-MCG PO TABS: 1.0000 | ORAL_TABLET | Freq: Every day | ORAL | 3 refills | Status: DC

## 2017-07-18 NOTE — Patient Instructions (Signed)
1.  No Pap smear done.  Next Pap smear is due in 2020 2.  Self breast awareness is encouraged 3.  Screening labs are declined 4.  Continue with healthy eating and exercise 5.  Refill birth control pills for contraception 6.  Continue Wellbutrin for PMS 7.  Return in 1 year for annual exam  Health Maintenance, Female Adopting a healthy lifestyle and getting preventive care can go a long way to promote health and wellness. Talk with your health care provider about what schedule of regular examinations is right for you. This is a good chance for you to check in with your provider about disease prevention and staying healthy. In between checkups, there are plenty of things you can do on your own. Experts have done a lot of research about which lifestyle changes and preventive measures are most likely to keep you healthy. Ask your health care provider for more information. Weight and diet Eat a healthy diet  Be sure to include plenty of vegetables, fruits, low-fat dairy products, and lean protein.  Do not eat a lot of foods high in solid fats, added sugars, or salt.  Get regular exercise. This is one of the most important things you can do for your health. ? Most adults should exercise for at least 150 minutes each week. The exercise should increase your heart rate and make you sweat (moderate-intensity exercise). ? Most adults should also do strengthening exercises at least twice a week. This is in addition to the moderate-intensity exercise.  Maintain a healthy weight  Body mass index (BMI) is a measurement that can be used to identify possible weight problems. It estimates body fat based on height and weight. Your health care provider can help determine your BMI and help you achieve or maintain a healthy weight.  For females 2 years of age and older: ? A BMI below 18.5 is considered underweight. ? A BMI of 18.5 to 24.9 is normal. ? A BMI of 25 to 29.9 is considered overweight. ? A BMI of  30 and above is considered obese.  Watch levels of cholesterol and blood lipids  You should start having your blood tested for lipids and cholesterol at 32 years of age, then have this test every 5 years.  You may need to have your cholesterol levels checked more often if: ? Your lipid or cholesterol levels are high. ? You are older than 32 years of age. ? You are at high risk for heart disease.  Cancer screening Lung Cancer  Lung cancer screening is recommended for adults 1-65 years old who are at high risk for lung cancer because of a history of smoking.  A yearly low-dose CT scan of the lungs is recommended for people who: ? Currently smoke. ? Have quit within the past 15 years. ? Have at least a 30-pack-year history of smoking. A pack year is smoking an average of one pack of cigarettes a day for 1 year.  Yearly screening should continue until it has been 15 years since you quit.  Yearly screening should stop if you develop a health problem that would prevent you from having lung cancer treatment.  Breast Cancer  Practice breast self-awareness. This means understanding how your breasts normally appear and feel.  It also means doing regular breast self-exams. Let your health care provider know about any changes, no matter how small.  If you are in your 20s or 30s, you should have a clinical breast exam (CBE) by a health  care provider every 1-3 years as part of a regular health exam.  If you are 88 or older, have a CBE every year. Also consider having a breast X-ray (mammogram) every year.  If you have a family history of breast cancer, talk to your health care provider about genetic screening.  If you are at high risk for breast cancer, talk to your health care provider about having an MRI and a mammogram every year.  Breast cancer gene (BRCA) assessment is recommended for women who have family members with BRCA-related cancers. BRCA-related cancers  include: ? Breast. ? Ovarian. ? Tubal. ? Peritoneal cancers.  Results of the assessment will determine the need for genetic counseling and BRCA1 and BRCA2 testing.  Cervical Cancer Your health care provider may recommend that you be screened regularly for cancer of the pelvic organs (ovaries, uterus, and vagina). This screening involves a pelvic examination, including checking for microscopic changes to the surface of your cervix (Pap test). You may be encouraged to have this screening done every 3 years, beginning at age 67.  For women ages 15-65, health care providers may recommend pelvic exams and Pap testing every 3 years, or they may recommend the Pap and pelvic exam, combined with testing for human papilloma virus (HPV), every 5 years. Some types of HPV increase your risk of cervical cancer. Testing for HPV may also be done on women of any age with unclear Pap test results.  Other health care providers may not recommend any screening for nonpregnant women who are considered low risk for pelvic cancer and who do not have symptoms. Ask your health care provider if a screening pelvic exam is right for you.  If you have had past treatment for cervical cancer or a condition that could lead to cancer, you need Pap tests and screening for cancer for at least 20 years after your treatment. If Pap tests have been discontinued, your risk factors (such as having a new sexual partner) need to be reassessed to determine if screening should resume. Some women have medical problems that increase the chance of getting cervical cancer. In these cases, your health care provider may recommend more frequent screening and Pap tests.  Colorectal Cancer  This type of cancer can be detected and often prevented.  Routine colorectal cancer screening usually begins at 32 years of age and continues through 32 years of age.  Your health care provider may recommend screening at an earlier age if you have risk factors  for colon cancer.  Your health care provider may also recommend using home test kits to check for hidden blood in the stool.  A small camera at the end of a tube can be used to examine your colon directly (sigmoidoscopy or colonoscopy). This is done to check for the earliest forms of colorectal cancer.  Routine screening usually begins at age 21.  Direct examination of the colon should be repeated every 5-10 years through 32 years of age. However, you may need to be screened more often if early forms of precancerous polyps or small growths are found.  Skin Cancer  Check your skin from head to toe regularly.  Tell your health care provider about any new moles or changes in moles, especially if there is a change in a mole's shape or color.  Also tell your health care provider if you have a mole that is larger than the size of a pencil eraser.  Always use sunscreen. Apply sunscreen liberally and repeatedly throughout the  day.  Protect yourself by wearing long sleeves, pants, a wide-brimmed hat, and sunglasses whenever you are outside.  Heart disease, diabetes, and high blood pressure  High blood pressure causes heart disease and increases the risk of stroke. High blood pressure is more likely to develop in: ? People who have blood pressure in the high end of the normal range (130-139/85-89 mm Hg). ? People who are overweight or obese. ? People who are African American.  If you are 33-25 years of age, have your blood pressure checked every 3-5 years. If you are 54 years of age or older, have your blood pressure checked every year. You should have your blood pressure measured twice-once when you are at a hospital or clinic, and once when you are not at a hospital or clinic. Record the average of the two measurements. To check your blood pressure when you are not at a hospital or clinic, you can use: ? An automated blood pressure machine at a pharmacy. ? A home blood pressure monitor.  If  you are between 59 years and 73 years old, ask your health care provider if you should take aspirin to prevent strokes.  Have regular diabetes screenings. This involves taking a blood sample to check your fasting blood sugar level. ? If you are at a normal weight and have a low risk for diabetes, have this test once every three years after 32 years of age. ? If you are overweight and have a high risk for diabetes, consider being tested at a younger age or more often. Preventing infection Hepatitis B  If you have a higher risk for hepatitis B, you should be screened for this virus. You are considered at high risk for hepatitis B if: ? You were born in a country where hepatitis B is common. Ask your health care provider which countries are considered high risk. ? Your parents were born in a high-risk country, and you have not been immunized against hepatitis B (hepatitis B vaccine). ? You have HIV or AIDS. ? You use needles to inject street drugs. ? You live with someone who has hepatitis B. ? You have had sex with someone who has hepatitis B. ? You get hemodialysis treatment. ? You take certain medicines for conditions, including cancer, organ transplantation, and autoimmune conditions.  Hepatitis C  Blood testing is recommended for: ? Everyone born from [birth year] through 1966. ? Anyone with known risk factors for hepatitis C.  Sexually transmitted infections (STIs)  You should be screened for sexually transmitted infections (STIs) including gonorrhea and chlamydia if: ? You are sexually active and are younger than 32 years of age. ? You are older than 32 years of age and your health care provider tells you that you are at risk for this type of infection. ? Your sexual activity has changed since you were last screened and you are at an increased risk for chlamydia or gonorrhea. Ask your health care provider if you are at risk.  If you do not have HIV, but are at risk, it may be recommended  that you take a prescription medicine daily to prevent HIV infection. This is called pre-exposure prophylaxis (PrEP). You are considered at risk if: ? You are sexually active and do not regularly use condoms or know the HIV status of your partner(s). ? You take drugs by injection. ? You are sexually active with a partner who has HIV.  Talk with your health care provider about whether you are at high  risk of being infected with HIV. If you choose to begin PrEP, you should first be tested for HIV. You should then be tested every 3 months for as long as you are taking PrEP. Pregnancy  If you are premenopausal and you may become pregnant, ask your health care provider about preconception counseling.  If you may become pregnant, take 400 to 800 micrograms (mcg) of folic acid every day.  If you want to prevent pregnancy, talk to your health care provider about birth control (contraception). Osteoporosis and menopause  Osteoporosis is a disease in which the bones lose minerals and strength with aging. This can result in serious bone fractures. Your risk for osteoporosis can be identified using a bone density scan.  If you are 70 years of age or older, or if you are at risk for osteoporosis and fractures, ask your health care provider if you should be screened.  Ask your health care provider whether you should take a calcium or vitamin D supplement to lower your risk for osteoporosis.  Menopause may have certain physical symptoms and risks.  Hormone replacement therapy may reduce some of these symptoms and risks. Talk to your health care provider about whether hormone replacement therapy is right for you. Follow these instructions at home:  Schedule regular health, dental, and eye exams.  Stay current with your immunizations.  Do not use any tobacco products including cigarettes, chewing tobacco, or electronic cigarettes.  If you are pregnant, do not drink alcohol.  If you are  breastfeeding, limit how much and how often you drink alcohol.  Limit alcohol intake to no more than 1 drink per day for nonpregnant women. One drink equals 12 ounces of beer, 5 ounces of wine, or 1 ounces of hard liquor.  Do not use street drugs.  Do not share needles.  Ask your health care provider for help if you need support or information about quitting drugs.  Tell your health care provider if you often feel depressed.  Tell your health care provider if you have ever been abused or do not feel safe at home. This information is not intended to replace advice given to you by your health care provider. Make sure you discuss any questions you have with your health care provider. Document Released: 02/13/2011 Document Revised: 01/06/2016 Document Reviewed: 05/04/2015 Elsevier Interactive Patient Education  [employer].

## 2017-07-30 DIAGNOSIS — D225 Melanocytic nevi of trunk: Secondary | ICD-10-CM | POA: Diagnosis not present

## 2017-09-24 ENCOUNTER — Other Ambulatory Visit: Payer: Self-pay | Admitting: Obstetrics and Gynecology

## 2017-10-15 ENCOUNTER — Encounter: Payer: Self-pay | Admitting: Family Medicine

## 2017-10-15 ENCOUNTER — Ambulatory Visit: Payer: BLUE CROSS/BLUE SHIELD | Admitting: Family Medicine

## 2017-10-15 VITALS — BP 98/62 | HR 55 | Temp 98.3°F | Resp 16 | Wt 150.0 lb

## 2017-10-15 DIAGNOSIS — R1909 Other intra-abdominal and pelvic swelling, mass and lump: Secondary | ICD-10-CM

## 2017-10-15 NOTE — Patient Instructions (Signed)
Lymphadenopathy Lymphadenopathy refers to swollen or enlarged lymph glands, also called lymph nodes. Lymph glands are part of your body's defense (immune) system, which protects the body from infections, germs, and diseases. Lymph glands are found in many locations in your body, including the neck, underarm, and groin. Many things can cause lymph glands to become enlarged. When your immune system responds to germs, such as viruses or bacteria, infection-fighting cells and fluid build up. This causes the glands to grow in size. Usually, this is not something to worry about. The swelling and any soreness often go away without treatment. However, swollen lymph glands can also be caused by a number of diseases. Your health care provider may do various tests to help determine the cause. If the cause of your swollen lymph glands cannot be found, it is important to monitor your condition to make sure the swelling goes away. Follow these instructions at home: Watch your condition for any changes. The following actions may help to lessen any discomfort you are feeling:  Get plenty of rest.  Take medicines only as directed by your health care provider. Your health care provider may recommend over-the-counter medicines for pain.  Apply moist heat compresses to the site of swollen lymph nodes as directed by your health care provider. This can help reduce any pain.  Check your lymph nodes daily for any changes.  Keep all follow-up visits as directed by your health care provider. This is important.  Contact a health care provider if:  Your lymph nodes are still swollen after 2 weeks.  Your swelling increases or spreads to other areas.  Your lymph nodes are hard, seem fixed to the skin, or are growing rapidly.  Your skin over the lymph nodes is red and inflamed.  You have a fever.  You have chills.  You have fatigue.  You develop a sore throat.  You have abdominal pain.  You have weight  loss.  You have night sweats. Get help right away if:  You notice fluid leaking from the area of the enlarged lymph node.  You have severe pain in any area of your body.  You have chest pain.  You have shortness of breath. This information is not intended to replace advice given to you by your health care provider. Make sure you discuss any questions you have with your health care provider. Document Released: 05/09/2008 Document Revised: 01/06/2016 Document Reviewed: 03/05/2014 Elsevier Interactive Patient Education  2018 Elsevier Inc.  

## 2017-10-15 NOTE — Assessment & Plan Note (Signed)
Suspect enlarged lymph node given location and firmness US to evaluate further No other LAD palpable Check CBC Further work-up pending US results.

## 2017-10-15 NOTE — Progress Notes (Signed)
Patient: Allison Huynh Female    DOB: [birth year]   31 y.o.   MRN: [id number] Visit Date: 10/15/2017  Today's Provider: Shirlee Latch, MD   I, Joslyn Hy, CMA, am acting as scribe for Shirlee Latch, MD.  Chief Complaint  Patient presents with  . Groin Pain   Subjective:    Groin Pain  This is a new problem. Episode onset: has noticed groin intermittent groin pain since Spetember, but worsened last night. The pain is mild (states the pain is not sharp or stabbing, but causes nausea if it is touched). The problem affects the right side. Associated symptoms include nausea. Pertinent negatives include no abdominal pain, anorexia, back pain, chills, constipation, discolored urine, dysuria, fever, flank pain, frequency or vomiting. The symptoms are aggravated by tactile pressure. She has tried nothing for the symptoms. She is sexually active. She uses oral contraceptives for contraception.   She reports R ingiuinal mass that has been palpable since yesterday.  She thought she could feel it in September but then it seemed to go away and come back. Worse with pushing on the bump or doing sit ups    Allergies  Allergen Reactions  . Polysporin [Bacitracin-Polymyxin B]   . Codeine Rash     Current Outpatient Medications:  .  AMITIZA 24 MCG capsule, , Disp: , Rfl:  .  busPIRone (BUSPAR) 7.5 MG tablet, TAKE ONE TABLET BY MOUTH TWICE A DAY, Disp: 60 tablet, Rfl: 0 .  docusate sodium (COLACE) 250 MG capsule, Take 500 mg by mouth daily., Disp: , Rfl:  .  loratadine (CLARITIN) 10 MG tablet, Take 10 mg by mouth daily., Disp: , Rfl:  .  Multiple Vitamin (MULTIVITAMIN) tablet, Take 1 tablet by mouth daily., Disp: , Rfl:  .  norethindrone-ethinyl estradiol 1/35 (ALAYCEN 1/35) tablet, Take 1 tablet by mouth daily., Disp: 90 tablet, Rfl: 3 .  Omega-3 Fatty Acids (FISH OIL PO), Take by mouth., Disp: , Rfl:  .  polyethylene glycol powder (GLYCOLAX/MIRALAX) powder, Take 17 g by mouth  daily., Disp: , Rfl:   Review of Systems  Constitutional: Negative for chills and fever.  Respiratory: Negative.   Cardiovascular: Negative.   Gastrointestinal: Positive for nausea. Negative for abdominal pain, anorexia, constipation and vomiting.  Genitourinary: Negative for dysuria, flank pain and frequency.  Musculoskeletal: Negative for back pain.  Neurological: Negative.   Hematological: Negative.     Social History   Tobacco Use  . Smoking status: Never Smoker  . Smokeless tobacco: Never Used  Substance Use Topics  . Alcohol use: Yes    Comment: 3 q week- beer/wine   Objective:   BP 98/62 (BP Location: Left Arm, Patient Position: Sitting, Cuff Size: Normal)   Pulse (!) 55   Temp 98.3 F (36.8 C) (Oral)   Resp 16   Wt 150 lb (68 kg)   LMP 09/17/2017   SpO2 99%   BMI 26.57 kg/m  Vitals:   10/15/17 1347  BP: 98/62  Pulse: (!) 55  Resp: 16  Temp: 98.3 F (36.8 C)  TempSrc: Oral  SpO2: 99%  Weight: 150 lb (68 kg)     Physical Exam  Constitutional: She is oriented to person, place, and time. She appears well-developed and well-nourished. No distress.  HENT:  Head: Normocephalic and atraumatic.  Eyes: Conjunctivae are normal. No scleral icterus.  Cardiovascular: Normal rate, regular rhythm, normal heart sounds and intact distal pulses.  No murmur heard. Pulmonary/Chest: Effort normal and breath sounds  normal. No respiratory distress. She has no wheezes. She has no rales.  Abdominal: Soft. She exhibits no distension. There is no tenderness.  Musculoskeletal: She exhibits no edema.  Lymphadenopathy:       Head (right side): No submental, no submandibular, no tonsillar, no preauricular, no posterior auricular and no occipital adenopathy present.       Head (left side): No submental, no submandibular, no tonsillar, no preauricular, no posterior auricular and no occipital adenopathy present.    She has no cervical adenopathy.    She has no axillary adenopathy.        Right: No supraclavicular adenopathy present.       Left: No inguinal and no supraclavicular adenopathy present.  approx 2cm mobile mass in R inguinal crease that is TTP.  Neurological: She is alert and oriented to person, place, and time.  Skin: Skin is warm and dry. No rash noted.  Psychiatric: She has a normal mood and affect. Her behavior is normal.  Vitals reviewed.       Assessment & Plan:     Problem List Items Addressed This Visit      Other   Left groin mass - Primary    Suspect enlarged lymph node given location and firmness US to evaluate further No other LAD palpable Check CBC Further work-up pending US results.      Relevant Orders   CBC with Differential/Platelet   US Pelvis Limited       Return if symptoms worsen or fail to improve.   The entirety of the information documented in the History of Present Illness, Review of Systems and Physical Exam were personally obtained by me. Portions of this information were initially documented by Irving BurtonEmily Ratchford, CMA and reviewed by me for thoroughness and accuracy.    Erasmo DownerBacigalupo, Miroslav Gin M, MD, MPH [facility] 10/15/2017 4:47 PM

## 2017-10-16 ENCOUNTER — Telehealth: Payer: Self-pay | Admitting: Family Medicine

## 2017-10-16 ENCOUNTER — Telehealth: Payer: Self-pay

## 2017-10-16 DIAGNOSIS — R1909 Other intra-abdominal and pelvic swelling, mass and lump: Secondary | ICD-10-CM

## 2017-10-16 LAB — CBC WITH DIFFERENTIAL/PLATELET
Basophils Absolute: 0 10*3/uL (ref 0.0–0.2)
Basos: 1 %
EOS (ABSOLUTE): 0 10*3/uL (ref 0.0–0.4)
Eos: 1 %
Hematocrit: 41.1 % (ref 34.0–46.6)
Hemoglobin: 13.5 g/dL (ref 11.1–15.9)
IMMATURE GRANS (ABS): 0 10*3/uL (ref 0.0–0.1)
IMMATURE GRANULOCYTES: 0 %
LYMPHS: 44 %
Lymphocytes Absolute: 2 10*3/uL (ref 0.7–3.1)
MCH: 29.5 pg (ref 26.6–33.0)
MCHC: 32.8 g/dL (ref 31.5–35.7)
MCV: 90 fL (ref 79–97)
Monocytes Absolute: 0.4 10*3/uL (ref 0.1–0.9)
Monocytes: 8 %
NEUTROS ABS: 2.1 10*3/uL (ref 1.4–7.0)
NEUTROS PCT: 46 %
PLATELETS: 291 10*3/uL (ref 150–379)
RBC: 4.58 x10E6/uL (ref 3.77–5.28)
RDW: 12.4 % (ref 12.3–15.4)
WBC: 4.5 10*3/uL (ref 3.4–10.8)

## 2017-10-16 NOTE — Telephone Encounter (Signed)
-----   Message from Erasmo DownerAngela M Bacigalupo, MD sent at 10/16/2017 10:11 AM EST ----- Normal blood counts  Beryle FlockBacigalupo, Marzella SchleinAngela M, MD, MPH [facility] 10/16/2017 10:11 AM

## 2017-10-16 NOTE — Telephone Encounter (Signed)
Please review

## 2017-10-16 NOTE — Telephone Encounter (Signed)
New order for lower extremity US soft tissue placed. I had missed ordered last time.  It is actually a Right groin mass, so I ordered RLE US.  Erasmo DownerBacigalupo, Reice Bienvenue M, MD, MPH [facility] 10/16/2017 11:24 AM

## 2017-10-16 NOTE — Telephone Encounter (Signed)
Pt advised.

## 2017-10-16 NOTE — Telephone Encounter (Signed)
[facility] test for diagnosis of left groin mass would be LLE soft tissue non vascular [id number].Pt has called this morning about getting this scheduled,Thanks

## 2017-10-18 ENCOUNTER — Ambulatory Visit (HOSPITAL_COMMUNITY)
Admission: RE | Admit: 2017-10-18 | Discharge: 2017-10-18 | Disposition: A | Discharge status: Home / Self Care | Payer: BLUE CROSS/BLUE SHIELD | Source: Ambulatory Visit | Attending: Family Medicine | Admitting: Family Medicine

## 2017-10-18 DIAGNOSIS — R599 Enlarged lymph nodes, unspecified: Secondary | ICD-10-CM | POA: Diagnosis not present

## 2017-10-18 DIAGNOSIS — R1909 Other intra-abdominal and pelvic swelling, mass and lump: Secondary | ICD-10-CM | POA: Diagnosis not present

## 2017-10-19 ENCOUNTER — Other Ambulatory Visit: Payer: Self-pay | Admitting: Family Medicine

## 2017-10-19 ENCOUNTER — Telehealth: Payer: Self-pay | Admitting: Family Medicine

## 2017-10-19 ENCOUNTER — Telehealth: Payer: Self-pay

## 2017-10-19 DIAGNOSIS — R1909 Other intra-abdominal and pelvic swelling, mass and lump: Secondary | ICD-10-CM

## 2017-10-19 NOTE — Telephone Encounter (Signed)
Pt advised of results. See imaging results.

## 2017-10-19 NOTE — Telephone Encounter (Signed)
Pt calling stating she see the results from her US from yesterday and would like to know what the next step in going forward with this. Pt would like a nurse to return call today due to pt doesn't want to wait the weekend with out knowing. Thanks CC

## 2017-10-19 NOTE — Telephone Encounter (Signed)
Pt advised and acknowledges understanding.  

## 2017-10-19 NOTE — Telephone Encounter (Signed)
-----   Message from Erasmo DownerAngela M Bacigalupo, MD sent at 10/19/2017  9:39 AM EST ----- Multiple enlarged lymph nodes noted in area of concern.  Will refer to surgery for possible biopsy.  Erasmo DownerBacigalupo, Angela M, MD, MPH [facility] 10/19/2017 9:38 AM

## 2017-10-22 ENCOUNTER — Ambulatory Visit: Payer: Self-pay

## 2017-10-23 ENCOUNTER — Ambulatory Visit: Payer: BLUE CROSS/BLUE SHIELD | Admitting: Physician Assistant

## 2017-10-23 ENCOUNTER — Encounter: Payer: Self-pay | Admitting: Physician Assistant

## 2017-10-23 VITALS — BP 96/62 | HR 68 | Temp 98.1°F | Resp 16

## 2017-10-23 DIAGNOSIS — R238 Other skin changes: Secondary | ICD-10-CM

## 2017-10-23 MED ORDER — VALACYCLOVIR HCL 1 G PO TABS: 1000.0000 mg | ORAL_TABLET | Freq: Three times a day (TID) | ORAL | 0 refills | Status: AC

## 2017-10-23 MED ORDER — PREDNISONE 20 MG PO TABS: 60.0000 mg | ORAL_TABLET | Freq: Every day | ORAL | 0 refills | Status: AC

## 2017-10-23 NOTE — Progress Notes (Signed)
Patient: Allison Huynh Female    DOB: 07-02-1986   31 y.o.   MRN: [id number] Visit Date: 10/23/2017  Today's Provider: Trey SailorsAdriana M Ozella Comins, PA-C   Chief Complaint  Patient presents with  . Rash    Started last week, right side   Subjective:    Allison Huynh is a 32 y/o woman with a history of Bell's Palsy and enlarged right inguinal lymph nodes presenting today for rash on her buttocks. She was seen in clinic a couple weeks ago with enlarged right sided inguinal lymph nodes on exam. Ultrasound confirmed several enlarged right inguinal lymph nodes >1 cm. She is scheduled for biopsy in April with Dr. Lemar LivingsByrnett.  Most recently, in the past six days, she has noticed a rash on her right buttock that burns and itches. She thinks it is shingles. She says she has recently been tested for HSV and it has come back negative. She also reports she has a history of Bell's Palsy and that she feels her face is going weak. She denies right sided arm or leg weakness. She also reports numbness on the inside of this cheek which has happened multiple times prior to this episode. She denies change in vision, problems with balance. She denies problems closing her eyes, mouth, or drooling. She denies recent hiking or memory of a tick bite.   Of note, she has brain lesion listed on her problem list. She notes that when she had Bell's Palsy five years ago she underwent a brain MRI, which noted a brain lesion. She reports she saw a neurologist but that the lesion was not of concern and she was not thought to have MS. Rash  This is a new problem. The current episode started in the past 7 days. The rash is characterized by pain, redness, burning and itchiness. She was exposed to nothing. Past treatments include anti-itch cream. The treatment provided mild relief.       Allergies  Allergen Reactions  . Polysporin [Bacitracin-Polymyxin B]   . Codeine Rash     Current Outpatient Medications:  .  AMITIZA 24 MCG  capsule, , Disp: , Rfl:  .  busPIRone (BUSPAR) 7.5 MG tablet, TAKE ONE TABLET BY MOUTH TWICE A DAY, Disp: 60 tablet, Rfl: 0 .  docusate sodium (COLACE) 250 MG capsule, Take 500 mg by mouth daily., Disp: , Rfl:  .  loratadine (CLARITIN) 10 MG tablet, Take 10 mg by mouth daily., Disp: , Rfl:  .  Multiple Vitamin (MULTIVITAMIN) tablet, Take 1 tablet by mouth daily., Disp: , Rfl:  .  norethindrone-ethinyl estradiol 1/35 (ALAYCEN 1/35) tablet, Take 1 tablet by mouth daily., Disp: 90 tablet, Rfl: 3 .  Omega-3 Fatty Acids (FISH OIL PO), Take by mouth., Disp: , Rfl:  .  polyethylene glycol powder (GLYCOLAX/MIRALAX) powder, Take 17 g by mouth daily., Disp: , Rfl:   Review of Systems  Constitutional: Negative.   Skin: Positive for rash. Negative for color change, pallor and wound.  Neurological: Positive for numbness and headaches. Negative for dizziness, tremors, syncope, facial asymmetry, speech difficulty, weakness and light-headedness.    Social History   Tobacco Use  . Smoking status: Never Smoker  . Smokeless tobacco: Never Used  Substance Use Topics  . Alcohol use: Yes    Comment: 3 q week- beer/wine   Objective:   There were no vitals taken for this visit. There were no vitals filed for this visit.   Physical Exam  Constitutional: She is  oriented to person, place, and time. She appears well-developed and well-nourished.  Neurological: She is alert and oriented to person, place, and time. She has normal reflexes. No cranial nerve deficit. Coordination normal.  Skin: Skin is warm and dry. Rash noted.     Psychiatric: She has a normal mood and affect. Her behavior is normal.        Assessment & Plan:     1. Vesicular rash  She has a herpetic rash on her right buttock that is largely healed. It does not have any fluid to culture. She also reports persistent enlarged right sided inguinal nodes. Possible that these could be reactive 2/2 rash. Her biopsy is scheduled in April.  Instructed her to talk with Dr. B about necessity of biopsy if lymph nodes disappear with rash.  She feels she has the beginnings of Bell's Palsy. I do not observe any cranial nerve deficits, particularly in CN VI, however can treat empirically as below.   - valACYclovir (VALTREX) 1000 MG tablet; Take 1 tablet (1,000 mg total) by mouth 3 (three) times daily for 7 days.  Dispense: 21 tablet; Refill: 0 - predniSONE (DELTASONE) 20 MG tablet; Take 3 tablets (60 mg total) by mouth daily with breakfast for 7 days.  Dispense: 21 tablet; Refill: 0  Return if symptoms worsen or fail to improve.  The entirety of the information documented in the History of Present Illness, Review of Systems and Physical Exam were personally obtained by me. Portions of this information were initially documented by Kavin Leech, CMA and reviewed by me for thoroughness and accuracy.   I have spent 25 minutes with this patient, >50% of which was spent on counseling and coordination of care.       Trey Sailors, PA-C  [facility]

## 2017-10-23 NOTE — Patient Instructions (Signed)
Bell Palsy, Adult Bell palsy is a short-term inability to move muscles in part of the face. The inability to move (paralysis) results from inflammation or compression of the facial nerve, which travels along the skull and under the ear to the side of the face (7th cranial nerve). This nerve is responsible for facial movements that include blinking, closing the eyes, smiling, and frowning. What are the causes? The exact cause of this condition is not known. It may be caused by an infection from a virus, such as the chickenpox (herpes zoster), Epstein-Barr, or mumps virus. What increases the risk? You are more likely to develop this condition if:  You are pregnant.  You have diabetes.  You have had a recent infection in your nose, throat, or airways (upper respiratory infection).  You have a weakened body defense system (immune system).  You have had a facial injury, such as a fracture.  You have a family history of Bell palsy.  What are the signs or symptoms? Symptoms of this condition include:  Weakness on one side of the face.  Drooping eyelid and corner of the mouth.  Excessive tearing in one eye.  Difficulty closing the eyelid.  Dry eye.  Drooling.  Dry mouth.  Changes in taste.  Change in facial appearance.  Pain behind one ear.  Ringing in one or both ears.  Sensitivity to sound in one ear.  Facial twitching.  Headache.  Impaired speech.  Dizziness.  Difficulty eating or drinking.  Most of the time, only one side of the face is affected. Rarely, Bell palsy affects the whole face. How is this diagnosed? This condition is diagnosed based on:  Your symptoms.  Your medical history.  A physical exam.  You may also have to see health care providers who specialize in disorders of the nerves (neurologist) or diseases and conditions of the eye (ophthalmologist). You may have tests, such as:  A test to check for nerve damage (electromyogram).  Imaging  studies, such as CT or MRI scans.  Blood tests.  How is this treated? This condition affects every person differently. Sometimes symptoms go away without treatment within a couple weeks. If treatment is needed, it varies from person to person. The goal of treatment is to reduce inflammation and protect the eye from damage. Treatment for Bell palsy may include:  Medicines, such as: ? Steroids to reduce swelling and inflammation. ? Antiviral drugs. ? Pain relievers, including aspirin, acetaminophen, or ibuprofen.  Eye drops or ointment to keep your eye moist.  Eye protection, if you cannot close your eye.  Exercises or massage to regain muscle strength and function (physical therapy).  Follow these instructions at home:  Take over-the-counter and prescription medicines only as told by your health care provider.  If your eye is affected: ? Keep your eye moist with eye drops or ointment as told by your health care provider. ? Follow instructions for eye care and protection as told by your health care provider.  Do any physical therapy exercises as told by your health care provider.  Keep all follow-up visits as told by your health care provider. This is important. Contact a health care provider if:  You have a fever.  Your symptoms do not get better within 2-3 weeks, or your symptoms get worse.  Your eye is red, irritated, or painful.  You have new symptoms. Get help right away if:  You have weakness or numbness in a part of your body other than your face.    You have trouble swallowing.  You develop neck pain or stiffness.  You develop dizziness or shortness of breath. Summary  Bell palsy is a short-term inability to move muscles in part of the face. The inability to move (paralysis) results from inflammation or compression of the facial nerve.  This condition affects every person differently. Sometimes symptoms go away without treatment within a couple weeks.  If  treatment is needed, it varies from person to person. The goal of treatment is to reduce inflammation and protect the eye from damage.  Contact your health care provider if your symptoms do not get better within 2-3 weeks, or your symptoms get worse. This information is not intended to replace advice given to you by your health care provider. Make sure you discuss any questions you have with your health care provider. Document Released: 07/31/2005 Document Revised: 10/03/2016 Document Reviewed: 10/03/2016 Elsevier Interactive Patient Education  2018 [employer]  

## 2017-11-06 ENCOUNTER — Other Ambulatory Visit: Payer: Self-pay | Admitting: Obstetrics and Gynecology

## 2017-11-11 ENCOUNTER — Other Ambulatory Visit: Payer: Self-pay | Admitting: Obstetrics and Gynecology

## 2017-11-13 ENCOUNTER — Encounter: Payer: Self-pay | Admitting: General Surgery

## 2017-11-13 ENCOUNTER — Ambulatory Visit: Payer: BLUE CROSS/BLUE SHIELD | Admitting: General Surgery

## 2017-11-13 VITALS — BP 92/58 | HR 59 | Resp 12 | Ht 63.0 in | Wt 153.0 lb

## 2017-11-13 DIAGNOSIS — F4322 Adjustment disorder with anxiety: Secondary | ICD-10-CM | POA: Diagnosis not present

## 2017-11-13 DIAGNOSIS — R591 Generalized enlarged lymph nodes: Secondary | ICD-10-CM | POA: Diagnosis not present

## 2017-11-13 NOTE — Progress Notes (Signed)
Patient ID: Allison Huynh, female   DOB: 04/08/1986, 32 y.o.   MRN: [id number]  Chief Complaint  Patient presents with  . Other  . Mass    HPI Allison Huynh is a 32 y.o. female here today a evaluation of a right groin mass. Patient noticed this area about a month ago. The area has got smaller since the ultrasound done on 10/18/2017. She states the area feels like a dull ache. Like a pulled muscle.   HPI  Past Medical History:  Diagnosis Date  . Anxiety   . 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 Constipation     Past Surgical History:  Procedure Laterality Date  . LAPAROSCOPIC APPENDECTOMY  2005    Family History  Problem Relation Age of Onset  . GER disease Mother   . Ulcerative colitis Mother 64  . Diabetes Father   . Healthy Sister   . Bladder Cancer Paternal Grandfather 70       mets to colon, +smoker  . Skin cancer Maternal Aunt        lots of other maternal family members with skin cancer as well  . Breast cancer Neg Hx   . Ovarian cancer Neg Hx   . Heart disease Neg Hx     Social History Social History   Tobacco Use  . Smoking status: Never Smoker  . Smokeless tobacco: Never Used  Substance Use Topics  . Alcohol use: Yes    Comment: 3 q week- beer/wine  . Drug use: No    Allergies  Allergen Reactions  . Polysporin [Bacitracin-Polymyxin B]   . Codeine Rash    Current Outpatient Medications  Medication Sig Dispense Refill  . AMITIZA 24 MCG capsule     . busPIRone (BUSPAR) 7.5 MG tablet TAKE ONE TABLET BY MOUTH TWICE A DAY 60 tablet 6  . docusate sodium (COLACE) 250 MG capsule Take 500 mg by mouth daily.    Marland Kitchen loratadine (CLARITIN) 10 MG tablet Take 10 mg by mouth daily.    . Multiple Vitamin (MULTIVITAMIN) tablet Take 1 tablet by mouth daily.    . norethindrone-ethinyl estradiol 1/35 (ALAYCEN 1/35) tablet Take 1 tablet by mouth daily. 90 tablet 3  . Omega-3 Fatty Acids (FISH OIL PO) Take by mouth.    . polyethylene glycol powder (GLYCOLAX/MIRALAX)  powder Take 17 g by mouth daily.     No current facility-administered medications for this visit.     Review of Systems Review of Systems  Constitutional: Negative.  Negative for appetite change, chills, fever and unexpected weight change.  Respiratory: Negative.   Cardiovascular: Negative.   Gastrointestinal: Negative.   Endocrine: Negative for cold intolerance and heat intolerance.    Blood pressure (!) 92/58, pulse (!) 59, resp. rate 12, height 5\' 3"  (1.6 m), weight 153 lb (69.4 kg), last menstrual period 11/12/2017.  Physical Exam Physical Exam  Constitutional: She is oriented to person, place, and time. She appears well-developed and well-nourished.  Eyes: Conjunctivae are normal. No scleral icterus.  Neck: Neck supple.  Cardiovascular: Normal rate, regular rhythm and normal heart sounds.  Pulmonary/Chest: Effort normal and breath sounds normal.  Abdominal: Soft. Normal appearance. There is no tenderness. No hernia.  Lymphadenopathy:       Head (right side): No submandibular adenopathy present.       Head (left side): No submandibular adenopathy present.    She has no cervical adenopathy.    She has no axillary adenopathy.  Right: No inguinal adenopathy present.       Left: Inguinal (Small, non-tender nodes appreciated. ) adenopathy present.  Neurological: She is alert and oriented to person, place, and time.  Skin: Skin is warm and dry.    Data Reviewed Inguinal ultrasound of October 18, 2017 reviewed.  Multiple lymph nodes identified.  Assessment    Reactive lymphadenopathy, resolving.    Plan  The patient has no "B" symptoms to suggest lymphoma.  She reports the nodularity in the right groin has significantly improved over the last month.  On today's exam, no nodularity is noted on the right side and only small,Jelly Belly jellybean-sized lesions on the left are appreciated.  Without evidence of persistent lymphadenopathy, observation alone is  warranted. Patient to return as needed. The patient is aware to call back for any questions or concerns.   HPI, Physical Exam, Assessment and Plan have been scribed under the direction and in the presence of Donnalee CurryJeffrey Kaidyn Hernandes, MD.  Ples SpecterJessica Qualls, CMA  I have completed the exam and reviewed the above documentation for accuracy and completeness.  I agree with the above.  [employer] has been used and any errors in dictation or transcription are unintentional.  Donnalee CurryJeffrey Raijon Lindfors, M.D., F.A.C.S.   Allison Huynh 11/14/2017, 4:39 PM

## 2017-11-13 NOTE — Patient Instructions (Signed)
Patient to return as needed. The patient is aware to call back for any questions or concerns. 

## 2017-11-30 DIAGNOSIS — F4322 Adjustment disorder with anxiety: Secondary | ICD-10-CM | POA: Diagnosis not present

## 2017-12-11 DIAGNOSIS — F4322 Adjustment disorder with anxiety: Secondary | ICD-10-CM | POA: Diagnosis not present

## 2017-12-26 DIAGNOSIS — F4322 Adjustment disorder with anxiety: Secondary | ICD-10-CM | POA: Diagnosis not present

## 2018-01-02 DIAGNOSIS — F4322 Adjustment disorder with anxiety: Secondary | ICD-10-CM | POA: Diagnosis not present

## 2018-01-22 DIAGNOSIS — F4322 Adjustment disorder with anxiety: Secondary | ICD-10-CM | POA: Diagnosis not present

## 2018-02-26 DIAGNOSIS — F4322 Adjustment disorder with anxiety: Secondary | ICD-10-CM | POA: Diagnosis not present

## 2018-03-08 DIAGNOSIS — F4322 Adjustment disorder with anxiety: Secondary | ICD-10-CM | POA: Diagnosis not present

## 2018-04-13 DIAGNOSIS — F4322 Adjustment disorder with anxiety: Secondary | ICD-10-CM | POA: Diagnosis not present

## 2018-04-30 DIAGNOSIS — K5900 Constipation, unspecified: Secondary | ICD-10-CM | POA: Diagnosis not present

## 2018-05-07 ENCOUNTER — Ambulatory Visit (INDEPENDENT_AMBULATORY_CARE_PROVIDER_SITE_OTHER): Payer: BLUE CROSS/BLUE SHIELD | Admitting: Family Medicine

## 2018-05-07 ENCOUNTER — Encounter: Payer: Self-pay | Admitting: Family Medicine

## 2018-05-07 VITALS — BP 118/68 | HR 74 | Temp 98.4°F | Ht 63.0 in | Wt 150.4 lb

## 2018-05-07 DIAGNOSIS — Z0000 Encounter for general adult medical examination without abnormal findings: Secondary | ICD-10-CM

## 2018-05-07 DIAGNOSIS — R29818 Other symptoms and signs involving the nervous system: Secondary | ICD-10-CM | POA: Diagnosis not present

## 2018-05-07 DIAGNOSIS — G939 Disorder of brain, unspecified: Secondary | ICD-10-CM | POA: Diagnosis not present

## 2018-05-07 NOTE — Assessment & Plan Note (Signed)
As above, patient with reported history of Bell's palsy and brain lesions She is never received any treatment She has been worked up for MS and was not diagnosed with this, but it was also not entirely ruled out She is having worsening of her episodic and waxing and waning neurologic symptoms We have been unable to obtain records from her previous neurologist [facility]

## 2018-05-07 NOTE — Progress Notes (Signed)
Patient: Allison Huynh, Female    DOB: [birth year], 32 y.o.   MRN: [id number] Visit Date: 05/07/2018  Today's Provider: Shirlee LatchAngela Bacigalupo, MD   Chief Complaint  Patient presents with  . Annual Exam   Subjective:  I, Allison Huynh, CMA, am acting as a scribe for Shirlee LatchAngela Bacigalupo, MD.    Annual physical exam Allison Huynh is a 32 y.o. female who presents today for health maintenance and complete physical. She feels poorly. She reports exercising 4 days per week. She reports she is sleeping well with Melatonin.  [facility].  Last Pap was 07/26/2016 and was in [place] and HPV negative.  She states that her last Tdap was in May 2017.  She will try to get vaccination records from work.  Per report previously on today, patient had an episode of Bell's palsy at age 32.  She had an MRI and lesions were seen.  There were no bands on LP at that time.  She was seen by an MS specialist and told that she may or may not have MS and this may develop further in the future.  She did not need any medications.  She has reported in the past that she gets episodic numbness of her right side of her scalp and high stress times.  She has been having this again since 10/2017.  She was given a course of Valtrex and prednisone around that time as she thought she may have a recurrence of her Bell's palsy and she also had what appeared to be shingles of her right leg.  She states she also get paresthesias of her right ear.  She is having intermittent right arm numbness and weakening of her grip strength.  She sometimes feels that her words are difficult to get out as well. -----------------------------------------------------------------   Review of Systems  Constitutional: Negative.   HENT: Positive for dental problem.   Eyes: Positive for visual disturbance.  Respiratory: Negative.   Cardiovascular: Negative.   Gastrointestinal: Positive for constipation.  Endocrine: Negative.     Genitourinary: Positive for urgency.  Musculoskeletal: Positive for neck stiffness.  Skin: Negative.   Allergic/Immunologic: Positive for environmental allergies.  Neurological: Positive for light-headedness and numbness.  Hematological: Negative.   Psychiatric/Behavioral: Negative.     Social History      She  reports that she has never smoked. She has never used smokeless tobacco. She reports that she drinks alcohol. She reports that she does not use drugs.       Social History   Socioeconomic History  . Marital status: Married    Spouse name: Selena BattenCody  . Number of children: 0  . Years of education: Master's  . Highest education level: Not on file  Occupational History  . Occupation: Programmer, multimediadata analyst    Employer: [employer]  Social Needs  . Financial resource strain: Not on file  . Food insecurity:    Worry: Not on file    Inability: Not on file  . Transportation needs:    Medical: Not on file    Non-medical: Not on file  Tobacco Use  . Smoking status: Never Smoker  . Smokeless tobacco: Never Used  Substance and Sexual Activity  . Alcohol use: Yes    Comment: 3 q week- beer/wine  . Drug use: No  . Sexual activity: Yes    Partners: Male    Birth control/protection: Pill  Lifestyle  . Physical activity:    Days per  week: 3 days    Minutes per session: 60 min  . Stress: Not on file  Relationships  . Social connections:    Talks on phone: Not on file    Gets together: Not on file    Attends religious service: Not on file    Active member of club or organization: Not on file    Attends meetings of clubs or organizations: Not on file    Relationship status: Not on file  Other Topics Concern  . Not on file  Social History Narrative  . Not on file    Past Medical History:  Diagnosis Date  . Anxiety   . 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 Constipation      Patient Active Problem List   Diagnosis Date Noted  . Transient neurological symptoms 05/07/2018  .  Encounter for surveillance of contraceptive pills 07/18/2017  . Chronic constipation 05/04/2017  . 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 Family history of skin cancer 05/04/2017  . Anxiety 07/25/2016  . History of dysmenorrhea 07/25/2016  . PMS (premenstrual syndrome) 07/25/2016    Past Surgical History:  Procedure Laterality Date  . LAPAROSCOPIC APPENDECTOMY  2005    Family History        Family Status  Relation Name Status  . Mother  Alive  . Father  Alive  . Sister  Alive  . PGF  (Not Specified)  . Mat Aunt  (Not Specified)  . Neg Hx  (Not Specified)        Her family history includes Bladder Cancer (age of onset: 96) in her paternal grandfather; Diabetes in her father; GER disease in her mother; Healthy in her sister; Skin cancer in her maternal aunt; Ulcerative colitis (age of onset: 64) in her mother. There is no history of Breast cancer, Ovarian cancer, or Heart disease.      Allergies  Allergen Reactions  . Polysporin [Bacitracin-Polymyxin B]   . Codeine Rash     Current Outpatient Medications:  .  AMITIZA 24 MCG capsule, , Disp: , Rfl:  .  busPIRone (BUSPAR) 7.5 MG tablet, TAKE ONE TABLET BY MOUTH TWICE A DAY, Disp: 60 tablet, Rfl: 6 .  docusate sodium (COLACE) 250 MG capsule, Take 500 mg by mouth daily., Disp: , Rfl:  .  loratadine (CLARITIN) 10 MG tablet, Take 10 mg by mouth daily., Disp: , Rfl:  .  Multiple Vitamin (MULTIVITAMIN) tablet, Take 1 tablet by mouth daily., Disp: , Rfl:  .  norethindrone-ethinyl estradiol 1/35 (ALAYCEN 1/35) tablet, Take 1 tablet by mouth daily., Disp: 90 tablet, Rfl: 3 .  Omega-3 Fatty Acids (FISH OIL PO), Take by mouth., Disp: , Rfl:  .  polyethylene glycol powder (GLYCOLAX/MIRALAX) powder, Take 17 g by mouth daily., Disp: , Rfl:    Patient Care Team: Erasmo Downer, MD as PCP - General (Family Medicine)      Objective:   Vitals: BP 118/68 (BP Location: Right Arm, Patient Position: Sitting, Cuff Size: Normal)   Pulse 74    Temp 98.4 F (36.9 C) (Oral)   Ht 5\' 3"  (1.6 m)   Wt 150 lb 6.4 oz (68.2 kg)   SpO2 99%   BMI 26.64 kg/m    Vitals:   05/07/18 0918  BP: 118/68  Pulse: 74  Temp: 98.4 F (36.9 C)  TempSrc: Oral  SpO2: 99%  Weight: 150 lb 6.4 oz (68.2 kg)  Height: 5\' 3"  (1.6 m)     Physical Exam  Constitutional: She is oriented to person, place, and time. She appears well-developed and well-nourished. No distress.  HENT:  Head: Normocephalic and atraumatic.  Right Ear: External ear normal.  Left Ear: External ear normal.  Nose: Nose normal.  Mouth/Throat: Oropharynx is clear and moist.  Eyes: Pupils are equal, round, and reactive to light. Conjunctivae and EOM are normal. No scleral icterus.  Neck: Neck supple. No thyromegaly present.  Cardiovascular: Normal rate, regular rhythm, normal heart sounds and intact distal pulses.  No murmur heard. Pulmonary/Chest: Effort normal and breath sounds normal. No respiratory distress. She has no wheezes. She has no rales.  Abdominal: Soft. Bowel sounds are normal. She exhibits no distension. There is no tenderness. There is no rebound and no guarding.  Musculoskeletal: She exhibits no edema or deformity.  Lymphadenopathy:    She has no cervical adenopathy.    She has no axillary adenopathy.       Right: No inguinal adenopathy present.       Left: No inguinal adenopathy present.  Neurological: She is alert and oriented to person, place, and time. She has normal strength. No cranial nerve deficit or sensory deficit. She exhibits normal muscle tone. Coordination and gait normal.  Skin: Skin is warm and dry. Capillary refill takes less than 2 seconds. No rash noted.  Psychiatric: She has a normal mood and affect. Her behavior is normal.  Vitals reviewed.    Depression Screen PHQ 2/9 Scores 05/07/2018 05/04/2017  PHQ - 2 Score 0 0  PHQ- 9 Score 2 -     Assessment & Plan:     Routine Health Maintenance and Physical Exam  Exercise Activities and  Dietary recommendations Goals   None      There is no immunization history on file for this patient.  Health Maintenance  Topic Date Due  . TETANUS/TDAP  07/11/2004  . INFLUENZA VACCINE  12/12/2018 (Originally 03/14/2018)  . PAP SMEAR  07/27/2019  . HIV Screening  Completed     Discussed health benefits of physical activity, and encouraged her to engage in regular exercise appropriate for her age and condition.    --------------------------------------------------------------------  Problem List Items Addressed This Visit      Other   Lesion of brain    Patient with reported history of brain lesions that were not treated There was concern for possible MS She is having worsening of her episodic numbness and neurologic symptoms We have requested records from her previous neurologist in the past and been unable to obtain anything Will refer to [facility] or [facility] neurology      Relevant Orders   Ambulatory referral to Neurology   Transient neurological symptoms    As above, patient with reported history of Bell's palsy and brain lesions She is never received any treatment She has been worked up for MS and was not diagnosed with this, but it was also not entirely ruled out She is having worsening of her episodic and waxing and waning neurologic symptoms We have been unable to obtain records from her previous neurologist Will refer to [facility] or [facility] neurology      Relevant Orders   Ambulatory referral to Neurology   TSH   B12    Other Visit Diagnoses    Encounter for annual physical exam    -  Primary   Relevant Orders   CBC w/Diff/Platelet   Comprehensive metabolic panel   Lipid panel   TSH   B12       Return  in about 1 year (around 05/08/2019) for CPE.   The entirety of the information documented in the History of Present Illness, Review of Systems and Physical Exam were personally obtained by me. Portions of this information were initially documented by Allison Raddle,  CMA and reviewed by me for thoroughness and accuracy.    Erasmo Downer, MD, MPH [facility] 05/07/2018 10:15 AM

## 2018-05-07 NOTE — Patient Instructions (Signed)
Preventive Care 18-39 Years, Female Preventive care refers to lifestyle choices and visits with your health care provider that can promote health and wellness. What does preventive care include?  A yearly physical exam. This is also called an annual well check.  Dental exams once or twice a year.  Routine eye exams. Ask your health care provider how often you should have your eyes checked.  Personal lifestyle choices, including: ? Daily care of your teeth and gums. ? Regular physical activity. ? Eating a healthy diet. ? Avoiding tobacco and drug use. ? Limiting alcohol use. ? Practicing safe sex. ? Taking vitamin and mineral supplements as recommended by your health care provider. What happens during an annual well check? The services and screenings done by your health care provider during your annual well check will depend on your age, overall health, lifestyle risk factors, and family history of disease. Counseling Your health care provider may ask you questions about your:  Alcohol use.  Tobacco use.  Drug use.  Emotional well-being.  Home and relationship well-being.  Sexual activity.  Eating habits.  Work and work Statistician.  Method of birth control.  Menstrual cycle.  Pregnancy history.  Screening You may have the following tests or measurements:  Height, weight, and BMI.  Diabetes screening. This is done by checking your blood sugar (glucose) after you have not eaten for a while (fasting).  Blood pressure.  Lipid and cholesterol levels. These may be checked every 5 years starting at age 66.  Skin check.  Hepatitis C blood test.  Hepatitis B blood test.  Sexually transmitted disease (STD) testing.  BRCA-related cancer screening. This may be done if you have a family history of breast, ovarian, tubal, or peritoneal cancers.  Pelvic exam and Pap test. This may be done every 3 years starting at age 40. Starting at age 59, this may be done every 5  years if you have a Pap test in combination with an HPV test.  Discuss your test results, treatment options, and if necessary, the need for more tests with your health care provider. Vaccines Your health care provider may recommend certain vaccines, such as:  Influenza vaccine. This is recommended every year.  Tetanus, diphtheria, and acellular pertussis (Tdap, Td) vaccine. You may need a Td booster every 10 years.  Varicella vaccine. You may need this if you have not been vaccinated.  HPV vaccine. If you are 69 or younger, you may need three doses over 6 months.  Measles, mumps, and rubella (MMR) vaccine. You may need at least one dose of MMR. You may also need a second dose.  Pneumococcal 13-valent conjugate (PCV13) vaccine. You may need this if you have certain conditions and were not previously vaccinated.  Pneumococcal polysaccharide (PPSV23) vaccine. You may need one or two doses if you smoke cigarettes or if you have certain conditions.  Meningococcal vaccine. One dose is recommended if you are age 27-21 years and a first-year college student living in a residence hall, or if you have one of several medical conditions. You may also need additional booster doses.  Hepatitis A vaccine. You may need this if you have certain conditions or if you travel or work in places where you may be exposed to hepatitis A.  Hepatitis B vaccine. You may need this if you have certain conditions or if you travel or work in places where you may be exposed to hepatitis B.  Haemophilus influenzae type b (Hib) vaccine. You may need this if  you have certain risk factors.  Talk to your health care provider about which screenings and vaccines you need and how often you need them. This information is not intended to replace advice given to you by your health care provider. Make sure you discuss any questions you have with your health care provider. Document Released: 09/26/2001 Document Revised: 04/19/2016  Document Reviewed: 06/01/2015 Elsevier Interactive Patient Education  [employer].

## 2018-05-07 NOTE — Assessment & Plan Note (Signed)
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

## 2018-05-10 DIAGNOSIS — Z0000 Encounter for general adult medical examination without abnormal findings: Secondary | ICD-10-CM | POA: Diagnosis not present

## 2018-05-10 DIAGNOSIS — R29818 Other symptoms and signs involving the nervous system: Secondary | ICD-10-CM | POA: Diagnosis not present

## 2018-05-11 LAB — CBC WITH DIFFERENTIAL/PLATELET
BASOS ABS: 0.1 10*3/uL (ref 0.0–0.2)
Basos: 1 %
EOS (ABSOLUTE): 0 10*3/uL (ref 0.0–0.4)
Eos: 1 %
HEMOGLOBIN: 12.6 g/dL (ref 11.1–15.9)
Hematocrit: 38.4 % (ref 34.0–46.6)
Immature Grans (Abs): 0 10*3/uL (ref 0.0–0.1)
Immature Granulocytes: 0 %
LYMPHS ABS: 1.1 10*3/uL (ref 0.7–3.1)
Lymphs: 26 %
MCH: 29.5 pg (ref 26.6–33.0)
MCHC: 32.8 g/dL (ref 31.5–35.7)
MCV: 90 fL (ref 79–97)
MONOCYTES: 6 %
MONOS ABS: 0.3 10*3/uL (ref 0.1–0.9)
NEUTROS PCT: 66 %
Neutrophils Absolute: 2.9 10*3/uL (ref 1.4–7.0)
Platelets: 307 10*3/uL (ref 150–450)
RBC: 4.27 x10E6/uL (ref 3.77–5.28)
RDW: 11.6 % — AB (ref 12.3–15.4)
WBC: 4.4 10*3/uL (ref 3.4–10.8)

## 2018-05-11 LAB — COMPREHENSIVE METABOLIC PANEL
ALBUMIN: 4.2 g/dL (ref 3.5–5.5)
ALK PHOS: 42 IU/L (ref 39–117)
ALT: 17 IU/L (ref 0–32)
AST: 19 IU/L (ref 0–40)
Albumin/Globulin Ratio: 1.6 (ref 1.2–2.2)
BILIRUBIN TOTAL: 0.4 mg/dL (ref 0.0–1.2)
BUN / CREAT RATIO: 13 (ref 9–23)
BUN: 14 mg/dL (ref 6–20)
CO2: 20 mmol/L (ref 20–29)
CREATININE: 1.11 mg/dL — AB (ref 0.57–1.00)
Calcium: 9.2 mg/dL (ref 8.7–10.2)
Chloride: 104 mmol/L (ref 96–106)
GFR calc non Af Amer: 66 mL/min/{1.73_m2} (ref >59)
GFR, EST AFRICAN AMERICAN: 76 mL/min/{1.73_m2} (ref >59)
GLOBULIN, TOTAL: 2.6 g/dL (ref 1.5–4.5)
GLUCOSE: 89 mg/dL (ref 65–99)
Potassium: 4.6 mmol/L (ref 3.5–5.2)
SODIUM: 141 mmol/L (ref 134–144)
TOTAL PROTEIN: 6.8 g/dL (ref 6.0–8.5)

## 2018-05-11 LAB — LIPID PANEL
CHOLESTEROL TOTAL: 136 mg/dL (ref 100–199)
Chol/HDL Ratio: 1.8 ratio (ref 0.0–4.4)
HDL: 75 mg/dL (ref >39)
LDL Calculated: 52 mg/dL (ref 0–99)
Triglycerides: 45 mg/dL (ref 0–149)
VLDL Cholesterol Cal: 9 mg/dL (ref 5–40)

## 2018-05-11 LAB — TSH: TSH: 1.58 u[IU]/mL (ref 0.450–4.500)

## 2018-05-11 LAB — VITAMIN B12: Vitamin B-12: 596 pg/mL (ref 232–1245)

## 2018-06-14 ENCOUNTER — Other Ambulatory Visit: Payer: Self-pay | Admitting: Obstetrics and Gynecology

## 2018-07-19 ENCOUNTER — Other Ambulatory Visit: Payer: Self-pay | Admitting: Obstetrics and Gynecology

## 2018-07-25 DIAGNOSIS — R200 Anesthesia of skin: Secondary | ICD-10-CM | POA: Diagnosis not present

## 2018-07-25 DIAGNOSIS — G379 Demyelinating disease of central nervous system, unspecified: Secondary | ICD-10-CM | POA: Diagnosis not present

## 2018-07-25 DIAGNOSIS — R9089 Other abnormal findings on diagnostic imaging of central nervous system: Secondary | ICD-10-CM | POA: Diagnosis not present

## 2018-07-26 ENCOUNTER — Other Ambulatory Visit: Payer: Self-pay | Admitting: Neurology

## 2018-07-26 DIAGNOSIS — M5412 Radiculopathy, cervical region: Secondary | ICD-10-CM

## 2018-07-26 DIAGNOSIS — R200 Anesthesia of skin: Secondary | ICD-10-CM

## 2018-07-26 DIAGNOSIS — G379 Demyelinating disease of central nervous system, unspecified: Secondary | ICD-10-CM

## 2018-08-16 ENCOUNTER — Ambulatory Visit
Admission: RE | Admit: 2018-08-16 | Discharge: 2018-08-16 | Disposition: A | Discharge status: Home / Self Care | Payer: BLUE CROSS/BLUE SHIELD | Source: Ambulatory Visit | Attending: Neurology | Admitting: Neurology

## 2018-08-16 DIAGNOSIS — G379 Demyelinating disease of central nervous system, unspecified: Secondary | ICD-10-CM | POA: Diagnosis not present

## 2018-08-16 DIAGNOSIS — R200 Anesthesia of skin: Secondary | ICD-10-CM | POA: Diagnosis not present

## 2018-08-16 DIAGNOSIS — M5412 Radiculopathy, cervical region: Secondary | ICD-10-CM | POA: Insufficient documentation

## 2018-08-16 MED ORDER — GADOBUTROL 1 MMOL/ML IV SOLN
6.0000 mL | Freq: Once | INTRAVENOUS | Status: AC | PRN
  Administered 2018-08-16: 6 mL via INTRAVENOUS

## 2018-08-22 ENCOUNTER — Telehealth: Payer: Self-pay | Admitting: Obstetrics and Gynecology

## 2018-08-22 ENCOUNTER — Other Ambulatory Visit: Payer: Self-pay

## 2018-08-22 MED ORDER — NORETHINDRONE-ETH ESTRADIOL 1-35 MG-MCG PO TABS: 1.0000 | ORAL_TABLET | Freq: Every day | ORAL | 0 refills | Status: DC

## 2018-08-22 NOTE — Telephone Encounter (Signed)
The patient called and stated that she needs to check the status of the medication of her refill request that was sent from her pharmacy.  Please advise.

## 2018-08-23 ENCOUNTER — Other Ambulatory Visit: Payer: Self-pay | Admitting: Obstetrics and Gynecology

## 2018-08-27 ENCOUNTER — Other Ambulatory Visit: Payer: Self-pay | Admitting: Obstetrics and Gynecology

## 2018-08-28 ENCOUNTER — Ambulatory Visit (INDEPENDENT_AMBULATORY_CARE_PROVIDER_SITE_OTHER): Payer: BLUE CROSS/BLUE SHIELD | Admitting: Obstetrics and Gynecology

## 2018-08-28 ENCOUNTER — Other Ambulatory Visit (HOSPITAL_COMMUNITY)
Admission: RE | Admit: 2018-08-28 | Discharge: 2018-08-28 | Disposition: A | Discharge status: Home / Self Care | Payer: BLUE CROSS/BLUE SHIELD | Source: Ambulatory Visit | Attending: Obstetrics and Gynecology | Admitting: Obstetrics and Gynecology

## 2018-08-28 ENCOUNTER — Encounter: Payer: Self-pay | Admitting: Obstetrics and Gynecology

## 2018-08-28 VITALS — BP 111/73 | HR 54 | Ht 63.0 in | Wt 152.6 lb

## 2018-08-28 DIAGNOSIS — Z0000 Encounter for general adult medical examination without abnormal findings: Secondary | ICD-10-CM

## 2018-08-28 DIAGNOSIS — Z124 Encounter for screening for malignant neoplasm of cervix: Secondary | ICD-10-CM

## 2018-08-28 MED ORDER — LEVONORGEST-ETH ESTRAD 91-DAY 0.15-0.03 &0.01 MG PO TABS: 1.0000 | ORAL_TABLET | Freq: Every day | ORAL | 3 refills | Status: DC

## 2018-08-28 NOTE — Progress Notes (Signed)
Patient comes in today for her annual exam. She would like to discuss changing the Buspar to something else. She needs a Pap today.

## 2018-08-28 NOTE — Progress Notes (Signed)
HPI:      [name] is a 33 y.o. G0P0000 who LMP was No LMP recorded. (Menstrual status: Oral contraceptives).  Subjective:   She presents today for her annual examination.  She is taking OCPs as directed.  She has approximately 2 menstrual periods a year but she continues to experience PMDD type symptoms during which she supposed to bleed.  She is using the OCPs both for birth control and cycle control. She also states that she does not think that she needs the BuSpar any longer to help her sleep and would like to go off of it.    Hx: The following portions of the patient's history were reviewed and updated as appropriate:             She  has a past medical history of Anxiety, Brain lesion, and Constipation. She does not have any pertinent problems on file. She  has a past surgical history that includes laparoscopic appendectomy (2005). Her family history includes Bladder Cancer (age of onset: 51) in her paternal grandfather; Diabetes in her father; GER disease in her mother; Healthy in her sister; Skin cancer in her maternal aunt; Ulcerative colitis (age of onset: 5) in her mother. She  reports that she has never smoked. She has never used smokeless tobacco. She reports current alcohol use. She reports that she does not use drugs. She has a current medication list which includes the following prescription(s): amitiza, buspirone, docusate sodium, loratadine, multivitamin, norethindrone-ethinyl estradiol 1/35, omega-3 fatty acids, and polyethylene glycol powder. She is allergic to polysporin [bacitracin-polymyxin b] and codeine.       Review of Systems:  Review of Systems  Constitutional: Denied constitutional symptoms, night sweats, recent illness, fatigue, fever, insomnia and weight loss.  Eyes: Denied eye symptoms, eye pain, photophobia, vision change and visual disturbance.  Ears/Nose/Throat/Neck: Denied ear, nose, throat or neck symptoms, hearing loss, nasal discharge, sinus  congestion and sore throat.  Cardiovascular: Denied cardiovascular symptoms, arrhythmia, chest pain/pressure, edema, exercise intolerance, orthopnea and palpitations.  Respiratory: Denied pulmonary symptoms, asthma, pleuritic pain, productive sputum, cough, dyspnea and wheezing.  Gastrointestinal: Denied, gastro-esophageal reflux, melena, nausea and vomiting.  Genitourinary: Denied genitourinary symptoms including symptomatic vaginal discharge, pelvic relaxation issues, and urinary complaints.  Musculoskeletal: Denied musculoskeletal symptoms, stiffness, swelling, muscle weakness and myalgia.  Dermatologic: Denied dermatology symptoms, rash and scar.  Neurologic: Denied neurology symptoms, dizziness, headache, neck pain and syncope.  Psychiatric: Denied psychiatric symptoms, anxiety and depression.  Endocrine: Denied endocrine symptoms including hot flashes and night sweats.   Meds:   Current Outpatient Medications on File Prior to Visit  Medication Sig Dispense Refill  . AMITIZA 24 MCG capsule     . busPIRone (BUSPAR) 7.5 MG tablet TAKE 1 TABLET BY MOUTH TWICE A DAY 60 tablet 0  . docusate sodium (COLACE) 250 MG capsule Take 500 mg by mouth daily.    Marland Kitchen loratadine (CLARITIN) 10 MG tablet Take 10 mg by mouth daily.    . Multiple Vitamin (MULTIVITAMIN) tablet Take 1 tablet by mouth daily.    . norethindrone-ethinyl estradiol 1/35 (ALAYCEN 1/35) tablet Take 1 tablet by mouth daily. 1 Package 0  . Omega-3 Fatty Acids (FISH OIL PO) Take by mouth.    . polyethylene glycol powder (GLYCOLAX/MIRALAX) powder Take 17 g by mouth daily.     No current facility-administered medications on file prior to visit.     Objective:     Vitals:   08/28/18 1337  BP: 111/73  Pulse: Marland Kitchen)  54              Physical examination General NAD, Conversant  HEENT Atraumatic; Op clear with mmm.  Normo-cephalic. Pupils reactive. Anicteric sclerae  Thyroid/Neck Smooth without nodularity or enlargement. Normal ROM.   Neck Supple.  Skin No rashes, lesions or ulceration. Normal palpated skin turgor. No nodularity.  Breasts: No masses or discharge.  Symmetric.  No axillary adenopathy.  Lungs: Clear to auscultation.No rales or wheezes. Normal Respiratory effort, no retractions.  Heart: NSR.  No murmurs or rubs appreciated. No periferal edema  Abdomen: Soft.  Non-tender.  No masses.  No HSM. No hernia  Extremities: Moves all appropriately.  Normal ROM for age. No lymphadenopathy.  Neuro: Oriented to PPT.  Normal mood. Normal affect.     Pelvic:   Vulva: Normal appearance.  No lesions.  Vagina: No lesions or abnormalities noted.  Support: Normal pelvic support.  Urethra No masses tenderness or scarring.  Meatus Normal size without lesions or prolapse.  Cervix: Normal appearance.  No lesions.  Anus: Normal exam.  No lesions.  Perineum: Normal exam.  No lesions.        Bimanual   Uterus: Normal size.  Non-tender.  Mobile.  AV.  Adnexae: No masses.  Non-tender to palpation.  Cul-de-sac: Negative for abnormality.      Assessment:    G0P0000 Patient Active Problem List   Diagnosis Date Noted  . Transient neurological symptoms 05/07/2018  . Encounter for surveillance of contraceptive pills 07/18/2017  . Chronic constipation 05/04/2017  . 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 Family history of skin cancer 05/04/2017  . Anxiety 07/25/2016  . History of dysmenorrhea 07/25/2016  . PMS (premenstrual syndrome) 07/25/2016     1. Screening for cervical cancer   2. Encounter for annual physical exam     Patient would like to discontinue BuSpar   Plan:            1.  Basic Screening Recommendations The basic screening recommendations for asymptomatic women were discussed with the patient during her visit.  The age-appropriate recommendations were discussed with her and the rational for the tests reviewed.  When I am informed by the patient that another primary care physician has previously obtained the  age-appropriate tests and they are up-to-date, only outstanding tests are ordered and referrals given as necessary.  Abnormal results of tests will be discussed with her when all of her results are completed. Co-test performed Patient states she gets her other lab work with a different physician. 2.  Discussed changing pillows to better address her PMS type symptoms.  We will switch to the 69-month pill.  Risk benefits reviewed changing from 1 pill to the other discussed in detail. 3.  Patient will discontinue BuSpar after weaning period and see how she does completely off medication. Orders No orders of the defined types were placed in this encounter.   No orders of the defined types were placed in this encounter.       F/U  Return in about 1 year (around 08/29/2019) for Annual Physical.  Elonda Husky, M.D. 08/28/2018 2:08 PM

## 2018-09-02 LAB — CYTOLOGY - PAP
Diagnosis: NEGATIVE
HPV: NOT DETECTED

## 2018-09-05 DIAGNOSIS — G43109 Migraine with aura, not intractable, without status migrainosus: Secondary | ICD-10-CM | POA: Diagnosis not present

## 2018-09-05 DIAGNOSIS — R200 Anesthesia of skin: Secondary | ICD-10-CM | POA: Diagnosis not present

## 2018-09-05 DIAGNOSIS — G500 Trigeminal neuralgia: Secondary | ICD-10-CM | POA: Diagnosis not present

## 2018-09-05 DIAGNOSIS — R51 Headache: Secondary | ICD-10-CM | POA: Diagnosis not present

## 2018-09-12 ENCOUNTER — Other Ambulatory Visit: Payer: Self-pay | Admitting: Obstetrics and Gynecology

## 2018-09-20 ENCOUNTER — Other Ambulatory Visit: Payer: Self-pay | Admitting: Surgical

## 2018-09-20 MED ORDER — NORETHINDRONE-ETH ESTRADIOL 1-35 MG-MCG PO TABS: 1.0000 | ORAL_TABLET | Freq: Every day | ORAL | 11 refills | Status: DC

## 2018-12-05 DIAGNOSIS — R200 Anesthesia of skin: Secondary | ICD-10-CM | POA: Diagnosis not present

## 2019-03-02 DIAGNOSIS — L255 Unspecified contact dermatitis due to plants, except food: Secondary | ICD-10-CM | POA: Diagnosis not present

## 2019-05-12 NOTE — Progress Notes (Signed)
Patient: Allison Huynh, Female    DOB: Aug 23, 1985, 33 y.o.   MRN: [id number] Visit Date: 05/13/2019  Today's Provider: Shirlee Huynh , MD   Chief Complaint  Patient presents with  . Annual Exam   Subjective:    I, Allison Huynh, CMA, am acting as a scribe for Allison Huynh , MD.    Annual physical exam Allison Huynh is a 33 y.o. female who presents today for health maintenance and complete physical. She feels well. She reports exercising 3-4 days per week. She reports she is sleeping well.  ----------------------------------------------------------------- 09/03/2018 Negative Pap and HPV - sees GYN  Learned this summer that mother and sister are both on thyroid medications.  She gained some weight this summer and tried diet and exercise and did not lose weight.  She is worried about thyroid dysfunction  Review of Systems  Constitutional: Negative.   HENT: Negative.   Eyes: Negative.   Respiratory: Negative.   Cardiovascular: Negative.   Gastrointestinal: Negative.   Endocrine: Negative.   Genitourinary: Negative.   Musculoskeletal: Negative.   Skin: Negative.   Allergic/Immunologic: Negative.   Neurological: Negative.   Hematological: Negative.   Psychiatric/Behavioral: Negative.     Social History      She  reports that she has never smoked. She has never used smokeless tobacco. She reports current alcohol use. She reports that she does not use drugs.       Social History   Socioeconomic History  . Marital status: Married    Spouse name: Selena BattenCody  . Number of children: 0  . Years of education: Master's  . Highest education level: Not on file  Occupational History  . Occupation: Programmer, multimediadata analyst    Employer: [employer]  Social Needs  . Financial resource strain: Not on file  . Food insecurity    Worry: Not on file    Inability: Not on file  . Transportation needs    Medical: Not on file    Non-medical: Not on file  Tobacco Use  . Smoking status: Never  Smoker  . Smokeless tobacco: Never Used  Substance and Sexual Activity  . Alcohol use: Yes    Comment: 3 q week- beer/wine  . Drug use: No  . Sexual activity: Yes    Partners: Male    Birth control/protection: Pill  Lifestyle  . Physical activity    Days per week: 3 days    Minutes per session: 60 min  . Stress: Not on file  Relationships  . Social Musicianconnections    Talks on phone: Not on file    Gets together: Not on file    Attends religious service: Not on file    Active member of club or organization: Not on file    Attends meetings of clubs or organizations: Not on file    Relationship status: Not on file  Other Topics Concern  . Not on file  Social History Narrative  . Not on file    Past Medical History:  Diagnosis Date  . Anxiety   . 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 Constipation      Patient Active Problem List   Diagnosis Date Noted  . Transient neurological symptoms 05/07/2018  . Encounter for surveillance of contraceptive pills 07/18/2017  . Chronic constipation 05/04/2017  . Family history of skin cancer 05/04/2017  . Anxiety 07/25/2016  . History of dysmenorrhea 07/25/2016  . PMS (premenstrual syndrome) 07/25/2016    Past Surgical History:  Procedure Laterality Date  . LAPAROSCOPIC APPENDECTOMY  2005    Family History        Family Status  Relation Name Status  . Mother  Alive  . Father  Alive  . Sister  Alive  . PGF  (Not Specified)  . Mat Aunt  (Not Specified)  . Neg Hx  (Not Specified)        Her family history includes Bladder Cancer (age of onset: 81) in her paternal grandfather; Diabetes in her father; GER disease in her mother; Healthy in her sister; Skin cancer in her maternal aunt; Ulcerative colitis (age of onset: 22) in her mother. There is no history of Breast cancer, Ovarian cancer, or Heart disease.      Allergies  Allergen Reactions  . Polysporin [Bacitracin-Polymyxin B]   . Codeine Rash     Current Outpatient  Medications:  .  AMITIZA 24 MCG capsule, , Disp: , Rfl:  .  docusate sodium (COLACE) 250 MG capsule, Take 500 mg by mouth daily., Disp: , Rfl:  .  loratadine (CLARITIN) 10 MG tablet, Take 10 mg by mouth daily., Disp: , Rfl:  .  Multiple Vitamin (MULTIVITAMIN) tablet, Take 1 tablet by mouth daily., Disp: , Rfl:  .  norethindrone-ethinyl estradiol 1/35 (ALAYCEN 1/35) tablet, Take 1 tablet by mouth daily., Disp: 1 Package, Rfl: 11 .  Omega-3 Fatty Acids (FISH OIL PO), Take by mouth., Disp: , Rfl:  .  polyethylene glycol powder (GLYCOLAX/MIRALAX) powder, Take 17 g by mouth daily., Disp: , Rfl:    Patient Care Team: Virginia Crews, MD as PCP - General (Family Medicine)    Objective:    Vitals: BP 111/66 (BP Location: Right Arm, Patient Position: Sitting, Cuff Size: Normal)   Pulse (!) 52   Temp (!) 96.9 F (36.1 C) (Temporal)   Ht 5\' 3"  (1.6 m)   Wt 151 lb 12.8 oz (68.9 kg)   SpO2 99%   BMI 26.89 kg/m    Vitals:   05/13/19 0914  BP: 111/66  Pulse: (!) 52  Temp: (!) 96.9 F (36.1 C)  TempSrc: Temporal  SpO2: 99%  Weight: 151 lb 12.8 oz (68.9 kg)  Height: 5\' 3"  (1.6 m)     Physical Exam Vitals signs reviewed.  Constitutional:      General: She is not in acute distress.    Appearance: Normal appearance. She is well-developed. She is not diaphoretic.  HENT:     Head: Normocephalic and atraumatic.     Right Ear: Tympanic membrane, ear canal and external ear normal.     Left Ear: Tympanic membrane, ear canal and external ear normal.  Eyes:     General: No scleral icterus.    Extraocular Movements: Extraocular movements intact.     Conjunctiva/sclera: Conjunctivae normal.     Pupils: Pupils are equal, round, and reactive to light.  Neck:     Musculoskeletal: Normal range of motion and neck supple.     Thyroid: No thyromegaly.  Cardiovascular:     Rate and Rhythm: Normal rate and regular rhythm.     Pulses: Normal pulses.     Heart sounds: Normal heart sounds. No  murmur.  Pulmonary:     Effort: Pulmonary effort is normal. No respiratory distress.     Breath sounds: Normal breath sounds. No wheezing or rales.  Abdominal:     General: There is no distension.     Palpations: Abdomen is soft.     Tenderness: There is no  abdominal tenderness.  Musculoskeletal:        General: No deformity.     Right lower leg: No edema.     Left lower leg: No edema.  Lymphadenopathy:     Cervical: No cervical adenopathy.     Lower Body: No right inguinal adenopathy. No left inguinal adenopathy.  Skin:    General: Skin is warm and dry.     Capillary Refill: Capillary refill takes less than 2 seconds.     Findings: No rash.  Neurological:     Mental Status: She is alert and oriented to person, place, and time. Mental status is at baseline.  Psychiatric:        Mood and Affect: Mood normal.        Behavior: Behavior normal.      Depression Screen PHQ 2/9 Scores 05/07/2018 05/04/2017  PHQ - 2 Score 0 0  PHQ- 9 Score 2 -       Assessment & Plan:     Routine Health Maintenance and Physical Exam  Exercise Activities and Dietary recommendations Goals   None     Immunization History  Administered Date(s) Administered  . Influenza,inj,Quad PF,6+ Mos 04/29/2019  . Tdap 12/29/2015    Health Maintenance  Topic Date Due  . PAP SMEAR-Modifier  08/28/2021  . TETANUS/TDAP  12/28/2025  . INFLUENZA VACCINE  Completed  . HIV Screening  Completed     Discussed health benefits of physical activity, and encouraged her to engage in regular exercise appropriate for her age and condition.    --------------------------------------------------------------------  Problem List Items Addressed This Visit    None    Visit Diagnoses    Encounter for annual physical exam    -  Primary   Relevant Orders   CBC with Differential/Platelet   Comprehensive metabolic panel   Lipid panel   TSH   Overweight       Relevant Orders   CBC with Differential/Platelet    Comprehensive metabolic panel   Lipid panel   TSH   Family history of thyroid disease       Relevant Orders   TSH       Return in about 1 year (around 05/12/2020).   The entirety of the information documented in the History of Present Illness, Review of Systems and Physical Exam were personally obtained by me. Portions of this information were initially documented by Allison Raddle, CMA and reviewed by me for thoroughness and accuracy.    , Marzella Schlein, MD MPH [facility]

## 2019-05-13 ENCOUNTER — Other Ambulatory Visit: Payer: Self-pay

## 2019-05-13 ENCOUNTER — Ambulatory Visit (INDEPENDENT_AMBULATORY_CARE_PROVIDER_SITE_OTHER): Payer: BC Managed Care – PPO | Admitting: Family Medicine

## 2019-05-13 ENCOUNTER — Encounter: Payer: Self-pay | Admitting: Family Medicine

## 2019-05-13 VITALS — BP 111/66 | HR 52 | Temp 96.9°F | Ht 63.0 in | Wt 151.8 lb

## 2019-05-13 DIAGNOSIS — Z8349 Family history of other endocrine, nutritional and metabolic diseases: Secondary | ICD-10-CM

## 2019-05-13 DIAGNOSIS — E663 Overweight: Secondary | ICD-10-CM | POA: Diagnosis not present

## 2019-05-13 DIAGNOSIS — Z0000 Encounter for general adult medical examination without abnormal findings: Secondary | ICD-10-CM | POA: Diagnosis not present

## 2019-05-13 NOTE — Progress Notes (Unsigned)
Patient: Allison Huynh, Female    DOB: [birth year], 33 y.o.   MRN: [id number] Visit Date: 05/13/2019  Today's Provider: Idelle Jo, CMA   No chief complaint on file.  Subjective:     Annual physical exam Allison Huynh is a 33 y.o. female who presents today for health maintenance and complete physical. She feels {DESC; WELL/FAIRLY WELL/POORLY:18703}. She reports exercising ***. She reports she is sleeping {DESC; WELL/FAIRLY WELL/POORLY:18703}.  -----------------------------------------------------------------   Review of Systems  Constitutional: Negative.   HENT: Negative.   Eyes: Negative.   Respiratory: Negative.   Cardiovascular: Negative.   Gastrointestinal: Negative.   Endocrine: Negative.   Genitourinary: Negative.   Musculoskeletal: Negative.   Skin: Negative.   Allergic/Immunologic: Negative.   Neurological: Negative.   Hematological: Negative.   Psychiatric/Behavioral: Negative.     Social History      She  reports that she has never smoked. She has never used smokeless tobacco. She reports current alcohol use. She reports that she does not use drugs.       Social History   Socioeconomic History  . Marital status: Married    Spouse name: Einar Pheasant  . Number of children: 0  . Years of education: Master's  . Highest education level: Not on file  Occupational History  . Occupation: Land: [place]  . Financial resource strain: Not on file  . Food insecurity    Worry: Not on file    Inability: Not on file  . Transportation needs    Medical: Not on file    Non-medical: Not on file  Tobacco Use  . Smoking status: Never Smoker  . Smokeless tobacco: Never Used  Substance and Sexual Activity  . Alcohol use: Yes    Comment: 3 q week- beer/wine  . Drug use: No  . Sexual activity: Yes    Partners: Male    Birth control/protection: Pill  Lifestyle  . Physical activity    Days per week: 3 days    Minutes per  session: 60 min  . Stress: Not on file  Relationships  . Social Herbalist on phone: Not on file    Gets together: Not on file    Attends religious service: Not on file    Active member of club or organization: Not on file    Attends meetings of clubs or organizations: Not on file    Relationship status: Not on file  Other Topics Concern  . Not on file  Social History Narrative  . Not on file    Past Medical History:  Diagnosis Date  . Anxiety   . 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 Constipation      Patient Active Problem List   Diagnosis Date Noted  . Transient neurological symptoms 05/07/2018  . Encounter for surveillance of contraceptive pills 07/18/2017  . Chronic constipation 05/04/2017  . Family history of skin cancer 05/04/2017  . Anxiety 07/25/2016  . History of dysmenorrhea 07/25/2016  . PMS (premenstrual syndrome) 07/25/2016    Past Surgical History:  Procedure Laterality Date  . LAPAROSCOPIC APPENDECTOMY  2005    Family History        Family Status  Relation Name Status  . Mother  Alive  . Father  Alive  . Sister  Alive  . PGF  (Not Specified)  . Mat Aunt  (Not Specified)  . Neg Hx  (Not Specified)  Her family history includes Bladder Cancer (age of onset: 99) in her paternal grandfather; Diabetes in her father; GER disease in her mother; Healthy in her sister; Skin cancer in her maternal aunt; Ulcerative colitis (age of onset: 70) in her mother. There is no history of Breast cancer, Ovarian cancer, or Heart disease.      Allergies  Allergen Reactions  . Polysporin [Bacitracin-Polymyxin B]   . Codeine Rash     Current Outpatient Medications:  .  AMITIZA 24 MCG capsule, , Disp: , Rfl:  .  docusate sodium (COLACE) 250 MG capsule, Take 500 mg by mouth daily., Disp: , Rfl:  .  docusate sodium (COLACE) 250 MG capsule, Take by mouth., Disp: , Rfl:  .  loratadine (CLARITIN) 10 MG tablet, Take 10 mg by mouth daily., Disp: , Rfl:   .  Multiple Vitamin (MULTIVITAMIN) tablet, Take 1 tablet by mouth daily., Disp: , Rfl:  .  norethindrone-ethinyl estradiol 1/35 (ALAYCEN 1/35) tablet, Take 1 tablet by mouth daily., Disp: 1 Package, Rfl: 11 .  Omega-3 Fatty Acids (FISH OIL PO), Take by mouth., Disp: , Rfl:  .  polyethylene glycol powder (GLYCOLAX/MIRALAX) powder, Take 17 g by mouth daily., Disp: , Rfl:    Patient Care Team: Erasmo Downer, MD as PCP - General (Family Medicine)    Objective:    Vitals: There were no vitals taken for this visit.  There were no vitals filed for this visit.   Physical Exam   Depression Screen PHQ 2/9 Scores 05/07/2018 05/04/2017  PHQ - 2 Score 0 0  PHQ- 9 Score 2 -       Assessment & Plan:     Routine Health Maintenance and Physical Exam  Exercise Activities and Dietary recommendations Goals   None     Immunization History  Administered Date(s) Administered  . Influenza,inj,Quad PF,6+ Mos 04/29/2019  . Tdap 12/29/2015    Health Maintenance  Topic Date Due  . PAP SMEAR-Modifier  08/28/2021  . TETANUS/TDAP  12/28/2025  . INFLUENZA VACCINE  Completed  . HIV Screening  Completed     Discussed health benefits of physical activity, and encouraged her to engage in regular exercise appropriate for her age and condition.    --------------------------------------------------------------------    Martyn Ehrich, CMA  [facility]

## 2019-05-13 NOTE — Patient Instructions (Signed)
Preventive Care 21-34 Years Old, Female Preventive care refers to visits with your health care provider and lifestyle choices that can promote health and wellness. This includes:  A yearly physical exam. This may also be called an annual well check.  Regular dental visits and eye exams.  Immunizations.  Screening for certain conditions.  Healthy lifestyle choices, such as eating a healthy diet, getting regular exercise, not using drugs or products that contain nicotine and tobacco, and limiting alcohol use. What can I expect for my preventive care visit? Physical exam Your health care provider will check your:  Height and weight. This may be used to calculate body mass index (BMI), which tells if you are at a healthy weight.  Heart rate and blood pressure.  Skin for abnormal spots. Counseling Your health care provider may ask you questions about your:  Alcohol, tobacco, and drug use.  Emotional well-being.  Home and relationship well-being.  Sexual activity.  Eating habits.  Work and work environment.  Method of birth control.  Menstrual cycle.  Pregnancy history. What immunizations do I need?  Influenza (flu) vaccine  This is recommended every year. Tetanus, diphtheria, and pertussis (Tdap) vaccine  You may need a Td booster every 10 years. Varicella (chickenpox) vaccine  You may need this if you have not been vaccinated. Human papillomavirus (HPV) vaccine  If recommended by your health care provider, you may need three doses over 6 months. Measles, mumps, and rubella (MMR) vaccine  You may need at least one dose of MMR. You may also need a second dose. Meningococcal conjugate (MenACWY) vaccine  One dose is recommended if you are age 19-21 years and a first-year college student living in a residence hall, or if you have one of several medical conditions. You may also need additional booster doses. Pneumococcal conjugate (PCV13) vaccine  You may need  this if you have certain conditions and were not previously vaccinated. Pneumococcal polysaccharide (PPSV23) vaccine  You may need one or two doses if you smoke cigarettes or if you have certain conditions. Hepatitis A vaccine  You may need this if you have certain conditions or if you travel or work in places where you may be exposed to hepatitis A. Hepatitis B vaccine  You may need this if you have certain conditions or if you travel or work in places where you may be exposed to hepatitis B. Haemophilus influenzae type b (Hib) vaccine  You may need this if you have certain conditions. You may receive vaccines as individual doses or as more than one vaccine together in one shot (combination vaccines). Talk with your health care provider about the risks and benefits of combination vaccines. What tests do I need?  Blood tests  Lipid and cholesterol levels. These may be checked every 5 years starting at age 20.  Hepatitis C test.  Hepatitis B test. Screening  Diabetes screening. This is done by checking your blood sugar (glucose) after you have not eaten for a while (fasting).  Sexually transmitted disease (STD) testing.  BRCA-related cancer screening. This may be done if you have a family history of breast, ovarian, tubal, or peritoneal cancers.  Pelvic exam and Pap test. This may be done every 3 years starting at age 21. Starting at age 30, this may be done every 5 years if you have a Pap test in combination with an HPV test. Talk with your health care provider about your test results, treatment options, and if necessary, the need for more tests.   Follow these instructions at home: Eating and drinking   Eat a diet that includes fresh fruits and vegetables, whole grains, lean protein, and low-fat dairy.  Take vitamin and mineral supplements as recommended by your health care provider.  Do not drink alcohol if: ? Your health care provider tells you not to drink. ? You are  pregnant, may be pregnant, or are planning to become pregnant.  If you drink alcohol: ? Limit how much you have to 0-1 drink a day. ? Be aware of how much alcohol is in your drink. In the [place] one drink equals one 12 oz bottle of beer (355 mL), one 5 oz glass of wine (148 mL), or one 1 oz glass of hard liquor (44 mL). Lifestyle  Take daily care of your teeth and gums.  Stay active. Exercise for at least 30 minutes on 5 or more days each week.  Do not use any products that contain nicotine or tobacco, such as cigarettes, e-cigarettes, and chewing tobacco. If you need help quitting, ask your health care provider.  If you are sexually active, practice safe sex. Use a condom or other form of birth control (contraception) in order to prevent pregnancy and STIs (sexually transmitted infections). If you plan to become pregnant, see your health care provider for a preconception visit. What's next?  Visit your health care provider once a year for a well check visit.  Ask your health care provider how often you should have your eyes and teeth checked.  Stay up to date on all vaccines. This information is not intended to replace advice given to you by your health care provider. Make sure you discuss any questions you have with your health care provider. Document Released: 09/26/2001 Document Revised: 04/11/2018 Document Reviewed: 04/11/2018 Elsevier Patient Education  2020 Elsevier Inc[employer]  

## 2019-05-14 ENCOUNTER — Telehealth: Payer: Self-pay

## 2019-05-14 LAB — CBC WITH DIFFERENTIAL/PLATELET
Basophils Absolute: 0.1 10*3/uL (ref 0.0–0.2)
Basos: 2 %
EOS (ABSOLUTE): 0 10*3/uL (ref 0.0–0.4)
Eos: 1 %
Hematocrit: 40.1 % (ref 34.0–46.6)
Hemoglobin: 12.8 g/dL (ref 11.1–15.9)
Immature Grans (Abs): 0 10*3/uL (ref 0.0–0.1)
Immature Granulocytes: 0 %
Lymphocytes Absolute: 1.5 10*3/uL (ref 0.7–3.1)
Lymphs: 40 %
MCH: 29.5 pg (ref 26.6–33.0)
MCHC: 31.9 g/dL (ref 31.5–35.7)
MCV: 92 fL (ref 79–97)
Monocytes Absolute: 0.3 10*3/uL (ref 0.1–0.9)
Monocytes: 9 %
Neutrophils Absolute: 1.8 10*3/uL (ref 1.4–7.0)
Neutrophils: 48 %
Platelets: 310 10*3/uL (ref 150–450)
RBC: 4.34 x10E6/uL (ref 3.77–5.28)
RDW: 11.5 % — ABNORMAL LOW (ref 11.7–15.4)
WBC: 3.8 10*3/uL (ref 3.4–10.8)

## 2019-05-14 LAB — COMPREHENSIVE METABOLIC PANEL
ALT: 12 IU/L (ref 0–32)
AST: 13 IU/L (ref 0–40)
Albumin/Globulin Ratio: 1.6 (ref 1.2–2.2)
Albumin: 4.2 g/dL (ref 3.8–4.8)
Alkaline Phosphatase: 48 IU/L (ref 39–117)
BUN/Creatinine Ratio: 14 (ref 9–23)
BUN: 12 mg/dL (ref 6–20)
Bilirubin Total: 0.3 mg/dL (ref 0.0–1.2)
CO2: 22 mmol/L (ref 20–29)
Calcium: 9.1 mg/dL (ref 8.7–10.2)
Chloride: 104 mmol/L (ref 96–106)
Creatinine, Ser: 0.83 mg/dL (ref 0.57–1.00)
GFR calc Af Amer: 107 mL/min/{1.73_m2} (ref >59)
GFR calc non Af Amer: 93 mL/min/{1.73_m2} (ref >59)
Globulin, Total: 2.7 g/dL (ref 1.5–4.5)
Glucose: 91 mg/dL (ref 65–99)
Potassium: 4.5 mmol/L (ref 3.5–5.2)
Sodium: 139 mmol/L (ref 134–144)
Total Protein: 6.9 g/dL (ref 6.0–8.5)

## 2019-05-14 LAB — LIPID PANEL
Chol/HDL Ratio: 2.1 ratio (ref 0.0–4.4)
Cholesterol, Total: 130 mg/dL (ref 100–199)
HDL: 62 mg/dL (ref >39)
LDL Chol Calc (NIH): 56 mg/dL (ref 0–99)
Triglycerides: 57 mg/dL (ref 0–149)
VLDL Cholesterol Cal: 12 mg/dL (ref 5–40)

## 2019-05-14 LAB — TSH: TSH: 2.1 u[IU]/mL (ref 0.450–4.500)

## 2019-05-14 NOTE — Telephone Encounter (Signed)
-----   Message from Virginia Crews, MD sent at 05/14/2019  8:56 AM EDT ----- Normal labs

## 2019-05-14 NOTE — Telephone Encounter (Signed)
Patient was advised.  

## 2019-07-03 ENCOUNTER — Other Ambulatory Visit: Payer: Self-pay

## 2019-07-03 ENCOUNTER — Encounter: Payer: Self-pay | Admitting: Adult Health

## 2019-07-03 ENCOUNTER — Ambulatory Visit: Payer: BC Managed Care – PPO | Admitting: Adult Health

## 2019-07-03 VITALS — BP 110/80 | HR 66 | Temp 96.8°F | Resp 16 | Wt 155.2 lb

## 2019-07-03 DIAGNOSIS — Z9229 Personal history of other drug therapy: Secondary | ICD-10-CM

## 2019-07-03 DIAGNOSIS — S61211A Laceration without foreign body of left index finger without damage to nail, initial encounter: Secondary | ICD-10-CM

## 2019-07-03 MED ORDER — CEPHALEXIN 500 MG PO CAPS: 500.0000 mg | ORAL_CAPSULE | Freq: Two times a day (BID) | ORAL | 0 refills | Status: DC

## 2019-07-03 NOTE — Progress Notes (Signed)
Patient: Allison Huynh Female    DOB: 1986/07/01   33 y.o.   MRN: [id number] Visit Date: 07/03/2019  Today's Provider: Jairo Ben, FNP   Chief Complaint  Patient presents with  . Laceration   Subjective:     Laceration  The incident occurred 3 to 5 days ago. The laceration is located on the left hand. The laceration mechanism was a clean knife. The pain is mild. The pain has been fluctuating since onset. She reports no foreign bodies present. Her tetanus status is UTD.  Patient reports that she has cleaned site with soap/water and hydrogen peroxide. She has some pain and swelling in her finger. She has not taking any ibupofen.  She reports bleeding stopped easily.  Swelling has increased to finger and mild bruise on finger.  Left hand pointer finger laceration. Area is sore " especially when I bend it ". Denies any drainage or bleeding now.  Denies any loss of function.   Patient  denies any fever, body aches,chills, rash, chest pain, shortness of breath, nausea, vomiting, or diarrhea.    Allergies  Allergen Reactions  . Polysporin [Bacitracin-Polymyxin B]   . Codeine Rash     Current Outpatient Medications:  .  AMITIZA 24 MCG capsule, , Disp: , Rfl:  .  docusate sodium (COLACE) 250 MG capsule, Take 500 mg by mouth daily., Disp: , Rfl:  .  loratadine (CLARITIN) 10 MG tablet, Take 10 mg by mouth daily., Disp: , Rfl:  .  Multiple Vitamin (MULTIVITAMIN) tablet, Take 1 tablet by mouth daily., Disp: , Rfl:  .  norethindrone-ethinyl estradiol 1/35 (ALAYCEN 1/35) tablet, Take 1 tablet by mouth daily., Disp: 1 Package, Rfl: 11 .  Omega-3 Fatty Acids (FISH OIL PO), Take by mouth., Disp: , Rfl:  .  polyethylene glycol powder (GLYCOLAX/MIRALAX) powder, Take 17 g by mouth daily., Disp: , Rfl:   Review of Systems  Constitutional: Negative.   Respiratory: Negative.   Cardiovascular: Negative.   Musculoskeletal: Negative.   Skin: Positive for color change (erythema  ) and wound. Negative for pallor and rash.  Neurological: Negative.   Psychiatric/Behavioral: Negative.     Social History   Tobacco Use  . Smoking status: Never Smoker  . Smokeless tobacco: Never Used  Substance Use Topics  . Alcohol use: Yes    Comment: 3 q week- beer/wine      Objective:   BP 110/80   Pulse 66   Temp (!) 96.8 F (36 C) (Oral)   Resp 16   Wt 155 lb 3.2 oz (70.4 kg)   SpO2 99%   BMI 27.49 kg/m  Vitals:   07/03/19 1350  BP: 110/80  Pulse: 66  Resp: 16  Temp: (!) 96.8 F (36 C)  TempSrc: Oral  SpO2: 99%  Weight: 155 lb 3.2 oz (70.4 kg)  Body mass index is 27.49 kg/m. Temporal thermometer   Physical Exam Vitals signs reviewed.  Constitutional:      Appearance: Normal appearance.  HENT:     Head: Normocephalic and atraumatic.     Nose: Nose normal.     Mouth/Throat:     Mouth: Mucous membranes are moist.  Eyes:     Extraocular Movements: Extraocular movements intact.     Pupils: Pupils are equal, round, and reactive to light.  Neck:     Musculoskeletal: Normal range of motion and neck supple.  Cardiovascular:     Rate and Rhythm: Normal rate and regular rhythm.  Pulses: Normal pulses.     Heart sounds: Normal heart sounds.  Pulmonary:     Effort: Pulmonary effort is normal.     Breath sounds: Normal breath sounds.  Musculoskeletal:        General: Tenderness and signs of injury present. No swelling or deformity.     Right hand: Normal.     Left hand: She exhibits tenderness (left pointer finger with swelling at anterior proximal phalange pain with bending, small bruise, area is approximately  2cm in size, closed and not gaping. ), laceration and swelling. She exhibits normal range of motion, no bony tenderness, normal two-point discrimination, normal capillary refill and no deformity. Normal sensation noted. Decreased sensation is not present in the ulnar distribution, is not present in the medial redistribution and is not present in  the radial distribution. Normal strength noted. She exhibits no finger abduction, no thumb/finger opposition and no wrist extension trouble.       Hands:     Right lower leg: No edema.     Left lower leg: No edema.     Comments: Normal range of motion finger, painful to bend due to swelling localized to the area of laceration and laceration.  Normal sensation. Normal control of fingers. No laxity at joints. 2+ radial pulse left.  Pain rated 3/10.   Lymphadenopathy:     Cervical: No cervical adenopathy.  Skin:    General: Skin is warm.     Capillary Refill: Capillary refill takes less than 2 seconds.     Findings: Erythema present.  Neurological:     Mental Status: She is alert and oriented to person, place, and time.     Gait: Gait normal.  Psychiatric:        Mood and Affect: Mood normal.        Behavior: Behavior normal.        Thought Content: Thought content normal.        Judgment: Judgment normal.      No results found for any visits on 07/03/19.     Assessment & Plan       1. Laceration of left index finger without foreign body without damage to nail, initial encounter  Unable to suture at this timeframe, and would is well approximated.  Mild surrounding erythema. Normal range of motion. Will treat for possible skin infection post laceration.   Meds ordered this encounter  Medications  . cephALEXin (KEFLEX) 500 MG capsule    Sig: Take 1 capsule (500 mg total) by mouth 2 (two) times daily.    Dispense:  14 capsule    Refill:  0   Ibuprofen 600 mg every 8 hours as needed for pain/ swelling. Discussed signs and symptoms of increasing infection and or tendon injury if any decreased motion occurs, increased pain or any loss of sensation/ numbness seek care immediately.  2. Up to date with diphtheria-tetanus vaccination  Tetanus in date 12/29/2015 - reviewed.   Return if symptoms worsen or fail to improve in 3-4 days, for at any time for any worsening symptoms, Go to  Emergency room/ urgent care if worse.   The entirety of the information documented in the History of Present Illness, Review of Systems and Physical Exam were personally obtained by me. Portions of this information were initially documented by the  Certified Medical Assistant whose name is documented in [place] and reviewed by me for thoroughness and accuracy.  I have personally performed the exam and reviewed the chart and  it is accurate to the best of my knowledge.  Museum/gallery conservatorDragon technology has been used and any errors in dictation or transcription are unintentional.  Eula FriedMichelle S. Mayetta Castleman FNP-C  [facility]   Jairo BenMichelle Smith Naarah Borgerding, FNP  [facility]

## 2019-07-03 NOTE — Patient Instructions (Addendum)
Start antibiotic today as directed.  Meds ordered this encounter  Medications  . cephALEXin (KEFLEX) 500 MG capsule    Sig: Take 1 capsule (500 mg total) by mouth 2 (two) times daily.    Dispense:  14 capsule    Refill:  0  Ibuprofen 600 to 800 mg every 8 hours as needed for pain.  Report any worsening symptoms to the office. Return if no improvement in 3- 4 days or if worsens at anytime.   Advised patient call the office or your primary care doctor for an appointment if no improvement within 72 hours or if any symptoms change or worsen at any time  Advised ER or urgent Care if after hours or on weekend. Call 911 for emergency symptoms at any time.Patinet verbalized understanding of all instructions given/reviewed and treatment plan and has no further questions or concerns at this time.      Nonsutured Laceration Care A laceration is a cut that may go through all layers of the skin and extend into the tissue that is right under the skin. This type of cut is usually stitched up (sutured) or closed with tape (adhesive strips) or skin glue shortly after the injury happens. However, if the wound is dirty or if several hours pass before medical treatment is provided, it is likely that germs (bacteria) will enter the wound. Closing a laceration after bacteria have entered it increases the risk of infection. In these cases, your health care provider may leave the laceration open (nonsutured) and cover it with a bandage. This type of treatment helps prevent infection and allows the wound to heal from the deepest layer of tissue damage up to the surface. An open fracture is a type of injury that may involve nonsutured lacerations. An open fracture is a break in a bone that happens along with lacerations through the skin at the fracture site. What are the risks? Caring for a nonsutured laceration is safe. However, problems may occur, including a higher risk for:  Scarring.  Infection.  Slow healing.  Supplies needed:  Soap.  Hand sanitizer.  Sterile water or irrigation solution.  Bandages (dressings).  Clean towel.  Antibiotic ointment. How to care for your nonsutured laceration Follow instructions from your health care provider about how to take care of your wound.  Keep the wound clean and dry.  Change any dressings as told by your health care provider. This includes changing the dressing when it starts to smell, or when it gets wet or dirty.  Clean the wound one time each day, or as often as told by your health care provider. To clean your wound: ? Wash your hands with soap and water. If soap and water are not available, use hand sanitizer. ? Remove any dressing as told by your health care provider. ? Clean the wound with sterile water or irrigation solution as told by your health care provider. ? Pat the wound dry with a clean towel. Do not rub the wound. ? Apply a thin layer of antibiotic ointment to the wound as told by your health care provider. This will prevent infection and keep the dressing from sticking to the wound. ? Apply a new dressing as told by your health care provider.  Check your wound every day for signs of infection. Watch for: ? Redness, swelling, or pain. ? Fluid, blood, or pus. ? Bad smell on the wound or dressing. ? Warmth.  Do not take baths, swim, or do anything that puts your  wound underwater until your health care provider approves.  Do not scratch or pick at the wound. Follow these instructions at home:  Take or apply over-the-counter and prescription medicines only as told by your health care provider.  If you were prescribed an antibiotic medicine, take or apply it as told by your health care provider. Do not stop using the antibiotic even if your condition improves.  Do not inject anything into the wound unless directed by your health care provider.  Raise (elevate) the injured area above the level of your heart while you are  sitting or lying down, if possible.  If directed, put ice on the affected area: ? Put ice in a plastic bag. ? Place a towel between your skin and the bag. ? Leave the ice on for 20 minutes, 2-3 times a day.  Keep all follow-up visits as told by your health care provider. This is important. Contact a health care provider if:  You received a tetanus shot and you have swelling, severe pain, redness, or bleeding at the injection site.  You have a fever.  Your pain is not controlled with medicine.  You have increased redness, swelling, or pain at the site of your wound.  You have fluid, blood, or pus coming from your wound.  You notice a bad smell coming from your wound or your dressing.  You notice something coming out of the wound, such as wood or glass.  You notice a change in the color of your skin near your wound.  You develop a new rash.  You need to change the dressing frequently due to fluid, blood, or pus draining from the wound.  You develop numbness around your wound. Get help right away if:  Your pain suddenly increases and is severe.  You develop severe swelling around the wound.  The wound is on your hand or foot and you cannot properly move a finger or toe.  The wound is on your hand or foot, and you notice that your fingers or toes look pale or bluish.  You have a red streak going away from your wound. Summary  A laceration is a cut that may go through all layers of the skin and extend into the tissue that is right under the skin. It is usually closed with stitches, tape, or skin glue shortly after the injury happens.  If a wound is dirty or if several hours pass before medical treatment is provided, the laceration may be kept open (nonsutured) and covered with a bandage.  This type of treatment helps prevent infection and allows the wound to heal from the deepest layer of tissue damage up to the surface.  Follow instructions from your health care provider  about how to take care of your wound. This information is not intended to replace advice given to you by your health care provider. Make sure you discuss any questions you have with your health care provider. Document Released: 06/28/2006 Document Revised: 11/22/2018 Document Reviewed: 08/20/2017 Elsevier Patient Education  2020 [employer].

## 2019-07-04 DIAGNOSIS — K5900 Constipation, unspecified: Secondary | ICD-10-CM | POA: Diagnosis not present

## 2019-08-01 ENCOUNTER — Other Ambulatory Visit: Payer: Self-pay | Admitting: Obstetrics and Gynecology

## 2019-09-02 ENCOUNTER — Encounter: Payer: Self-pay | Admitting: Obstetrics and Gynecology

## 2019-09-02 ENCOUNTER — Other Ambulatory Visit: Payer: Self-pay

## 2019-09-02 ENCOUNTER — Ambulatory Visit (INDEPENDENT_AMBULATORY_CARE_PROVIDER_SITE_OTHER): Payer: BC Managed Care – PPO | Admitting: Obstetrics and Gynecology

## 2019-09-02 VITALS — BP 104/70 | HR 62 | Wt 159.0 lb

## 2019-09-02 DIAGNOSIS — Z01419 Encounter for gynecological examination (general) (routine) without abnormal findings: Secondary | ICD-10-CM

## 2019-09-02 MED ORDER — LEVONORGEST-ETH ESTRAD 91-DAY 0.15-0.03 &0.01 MG PO TABS: 1.0000 | ORAL_TABLET | Freq: Every day | ORAL | 3 refills | Status: AC

## 2019-09-02 NOTE — Progress Notes (Signed)
HPI:      Allison Huynh is a 34 y.o. G0P0000 who LMP was No LMP recorded. (Menstrual status: Oral contraceptives).  Subjective:   She presents today for her annual examination.  She states that she has been doing well on the 55-month OCPs however over the summer she got "off track".  She took some of 1 pack and then lost the pack and had to restart in the second pack.  This led to some breakthrough bleeding.  During her current pack she has begun bleeding a few weeks early before her scheduled normal menses.  She has few premenstrual symptoms on these pills and would like to continue them. She does complain of some bilateral lower quadrant pain of recent origin. She was able to discontinue the BuSpar and is no longer taking that at all.  She is happy to be off of it.    Hx: The following portions of the patient's history were reviewed and updated as appropriate:             She  has a past medical history of Anxiety, Brain lesion, and Constipation. She does not have any pertinent problems on file. She  has a past surgical history that includes laparoscopic appendectomy (2005). Her family history includes Bladder Cancer (age of onset: 68) in her paternal grandfather; Diabetes in her father; GER disease in her mother; Skin cancer in her maternal aunt; Thyroid disease in her mother and sister; Ulcerative colitis (age of onset: 17) in her mother. She  reports that she has never smoked. She has never used smokeless tobacco. She reports current alcohol use. She reports that she does not use drugs. She has a current medication list which includes the following prescription(s): amitiza, docusate sodium, loratadine, multivitamin, omega-3 fatty acids, polyethylene glycol powder, and levonorgestrel-ethinyl estradiol. She is allergic to polysporin [bacitracin-polymyxin b] and codeine.       Review of Systems:  Review of Systems  Constitutional: Denied constitutional symptoms, night sweats, recent  illness, fatigue, fever, insomnia and weight loss.  Eyes: Denied eye symptoms, eye pain, photophobia, vision change and visual disturbance.  Ears/Nose/Throat/Neck: Denied ear, nose, throat or neck symptoms, hearing loss, nasal discharge, sinus congestion and sore throat.  Cardiovascular: Denied cardiovascular symptoms, arrhythmia, chest pain/pressure, edema, exercise intolerance, orthopnea and palpitations.  Respiratory: Denied pulmonary symptoms, asthma, pleuritic pain, productive sputum, cough, dyspnea and wheezing.  Gastrointestinal: Denied, gastro-esophageal reflux, melena, nausea and vomiting.  Genitourinary: Denied genitourinary symptoms including symptomatic vaginal discharge, pelvic relaxation issues, and urinary complaints.  Musculoskeletal: Denied musculoskeletal symptoms, stiffness, swelling, muscle weakness and myalgia.  Dermatologic: Denied dermatology symptoms, rash and scar.  Neurologic: Denied neurology symptoms, dizziness, headache, neck pain and syncope.  Psychiatric: Denied psychiatric symptoms, anxiety and depression.  Endocrine: Denied endocrine symptoms including hot flashes and night sweats.   Meds:   Current Outpatient Medications on File Prior to Visit  Medication Sig Dispense Refill  . AMITIZA 24 MCG capsule     . docusate sodium (COLACE) 250 MG capsule Take 500 mg by mouth daily.    Marland Kitchen loratadine (CLARITIN) 10 MG tablet Take 10 mg by mouth daily.    . Multiple Vitamin (MULTIVITAMIN) tablet Take 1 tablet by mouth daily.    . Omega-3 Fatty Acids (FISH OIL PO) Take by mouth.    . polyethylene glycol powder (GLYCOLAX/MIRALAX) powder Take 17 g by mouth daily.     No current facility-administered medications on file prior to visit.    Objective:  Vitals:   09/02/19 1516  BP: 104/70  Pulse: 62              Physical examination General NAD, Conversant  HEENT Atraumatic; Op clear with mmm.  Normo-cephalic. Pupils reactive. Anicteric sclerae  Thyroid/Neck  Smooth without nodularity or enlargement. Normal ROM.  Neck Supple.  Skin No rashes, lesions or ulceration. Normal palpated skin turgor. No nodularity.  Breasts: No masses or discharge.  Symmetric.  No axillary adenopathy.  Lungs: Clear to auscultation.No rales or wheezes. Normal Respiratory effort, no retractions.  Heart: NSR.  No murmurs or rubs appreciated. No periferal edema  Abdomen: Soft.  Non-tender.  No masses.  No HSM. No hernia  Extremities: Moves all appropriately.  Normal ROM for age. No lymphadenopathy.  Neuro: Oriented to PPT.  Normal mood. Normal affect.     Pelvic:   Vulva: Normal appearance.  No lesions.  Patient reports mild pain with palpation along the bilateral lymph node chains of the inguinal area.  No masses palpated.  This seems to be where the pain discussed in her subjective area above is localized.  Vagina: No lesions or abnormalities noted.  Support: Normal pelvic support.  Urethra No masses tenderness or scarring.  Meatus Normal size without lesions or prolapse.  Cervix: Normal appearance.  No lesions.  Large cervical ectropion  Anus: Normal exam.  No lesions.  Perineum: Normal exam.  No lesions.        Bimanual   Uterus: Normal size.  Non-tender.  Mobile.  AV.  Adnexae: No masses.  Non-tender to palpation.  Cul-de-sac: Negative for abnormality.      Assessment:    G0P0000 Patient Active Problem List   Diagnosis Date Noted  . Family history of thyroid disease 05/13/2019  . Transient neurological symptoms 05/07/2018  . Encounter for surveillance of contraceptive pills 07/18/2017  . Chronic constipation 05/04/2017  . Family history of skin cancer 05/04/2017  . Anxiety 07/25/2016  . History of dysmenorrhea 07/25/2016  . PMS (premenstrual syndrome) 07/25/2016     1. Well woman exam with routine gynecological exam     Patient doing well on 19-month OCPs, some issues with breakthrough bleeding mainly secondary to briefly taking the pills  incorrectly She would like to continue these pills.   Plan:            1.  Basic Screening Recommendations The basic screening recommendations for asymptomatic women were discussed with the patient during her visit.  The age-appropriate recommendations were discussed with her and the rational for the tests reviewed.  When I am informed by the patient that another primary care physician has previously obtained the age-appropriate tests and they are up-to-date, only outstanding tests are ordered and referrals given as necessary.  Abnormal results of tests will be discussed with her when all of her results are completed.  Routine preventative health maintenance measures emphasized: Exercise/Diet/Weight control, Tobacco Warnings, Alcohol/Substance use risks and Stress Management Continue OCPs-I expect her bleeding issues to resolve with continued regular use.  2.  No evidence of issue causing tender lymph node chain-expect resolution with expectant management. Orders No orders of the defined types were placed in this encounter.    Meds ordered this encounter  Medications  . Levonorgestrel-Ethinyl Estradiol (AMETHIA) 0.15-0.03 &0.01 MG tablet    Sig: Take 1 tablet by mouth daily.    Dispense:  91 tablet    Refill:  3        F/U  No follow-ups on file.  Shanon Brow  [place], M.D. 09/02/2019 3:38 PM

## 2020-02-10 ENCOUNTER — Emergency Department
Admission: EM | Admit: 2020-02-10 | Discharge: 2020-02-11 | Disposition: A | Discharge status: Home / Self Care | Payer: BC Managed Care – PPO | Attending: Emergency Medicine | Admitting: Emergency Medicine

## 2020-02-10 ENCOUNTER — Emergency Department: Payer: BC Managed Care – PPO

## 2020-02-10 ENCOUNTER — Other Ambulatory Visit: Payer: Self-pay

## 2020-02-10 DIAGNOSIS — R9431 Abnormal electrocardiogram [ECG] [EKG]: Secondary | ICD-10-CM | POA: Diagnosis not present

## 2020-02-10 DIAGNOSIS — S9031XA Contusion of right foot, initial encounter: Secondary | ICD-10-CM | POA: Diagnosis not present

## 2020-02-10 DIAGNOSIS — R55 Syncope and collapse: Secondary | ICD-10-CM | POA: Insufficient documentation

## 2020-02-10 DIAGNOSIS — Y929 Unspecified place or not applicable: Secondary | ICD-10-CM | POA: Insufficient documentation

## 2020-02-10 DIAGNOSIS — X58XXXA Exposure to other specified factors, initial encounter: Secondary | ICD-10-CM | POA: Insufficient documentation

## 2020-02-10 DIAGNOSIS — Y999 Unspecified external cause status: Secondary | ICD-10-CM | POA: Diagnosis not present

## 2020-02-10 DIAGNOSIS — Y939 Activity, unspecified: Secondary | ICD-10-CM | POA: Insufficient documentation

## 2020-02-10 MED ORDER — ONDANSETRON 4 MG PO TBDP: 4.0000 mg | ORAL_TABLET | Freq: Once | ORAL | Status: DC

## 2020-02-10 MED ORDER — ONDANSETRON HCL 4 MG/2ML IJ SOLN
4.0000 mg | Freq: Once | INTRAMUSCULAR | Status: AC
  Administered 2020-02-10: 4 mg via INTRAVENOUS
  Filled 2020-02-10: qty 2

## 2020-02-10 NOTE — ED Notes (Signed)
Pt calm upon arrival but reports feeling lightheaded and "out of it" husband staying for triage and then aware she will have to wait alone after that.

## 2020-02-10 NOTE — ED Provider Notes (Signed)
[facility] Emergency Department Provider Note  ____________________________________________   First MD Initiated Contact with Patient 02/10/20 2252     (approximate)  I have reviewed the triage vital signs and the nursing notes.   HISTORY  Chief Complaint Foot Pain    HPI Allison Huynh is a 34 y.o. female with medical history as listed below which includes "neurological issues" including a work-up for MS which was negative but with some suggestive symptoms and prior history of syncopal episodes thought to be vasovagal.  She presents tonight for evaluation of bruising and numbness to the side of her right foot as well as a syncopal episode, or multiple syncopal episodes in short succession.  She and her husband report that she had a big day today with outside activities, recently moving, etc.  There was no known trauma and she cannot remember a specific moment of sustaining an injury to her right foot.  However she was taking a hot shower and looked down and saw bruising to the right side of her right foot.  At that point she became very lightheaded and flushed and said that she knew she was going to pass out based on prior experiences.  She passed out and she did not sustain any injuries because she was able to lie down but she reports that she had a couple of episodes back to back before she settled down and felt more normal again.  She had at least one episode of vomiting and still has a little bit of persistent nausea.  Otherwise she feels fine.  She has been able to eat and drink recently.  She has no pain including in her chest, abdomen, nor in her foot.  She said that the side of the foot is a little bit numb where the bruising is present, but otherwise she has no pain and no difficulty with ambulation.  No focal weakness.  No recent fever.  Symptoms were acute in onset and severe.  Currently she feels fine except for some residual nausea.         Past  Medical History:  Diagnosis Date  . Anxiety   . 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 Constipation     Patient Active Problem List   Diagnosis Date Noted  . Family history of thyroid disease 05/13/2019  . Transient neurological symptoms 05/07/2018  . Encounter for surveillance of contraceptive pills 07/18/2017  . Chronic constipation 05/04/2017  . Family history of skin cancer 05/04/2017  . Anxiety 07/25/2016  . History of dysmenorrhea 07/25/2016  . PMS (premenstrual syndrome) 07/25/2016    Past Surgical History:  Procedure Laterality Date  . LAPAROSCOPIC APPENDECTOMY  2005    Prior to Admission medications   Medication Sig Start Date End Date Taking? Authorizing Provider  AMITIZA 24 MCG capsule  05/20/16   [provider]  docusate sodium (COLACE) 250 MG capsule Take 500 mg by mouth daily.    [provider]  Levonorgestrel-Ethinyl Estradiol (AMETHIA) 0.15-0.03 &0.01 MG tablet Take 1 tablet by mouth daily. 09/02/19 12/02/19  Linzie Collin, MD  loratadine (CLARITIN) 10 MG tablet Take 10 mg by mouth daily.    [provider]  Multiple Vitamin (MULTIVITAMIN) tablet Take 1 tablet by mouth daily.    [provider]  Omega-3 Fatty Acids (FISH OIL PO) Take by mouth.    [provider]  polyethylene glycol powder (GLYCOLAX/MIRALAX) powder Take 17 g by mouth daily.    [provider]    Allergies Polysporin [bacitracin-polymyxin b] and Codeine  Family History  Problem Relation Age of Onset  . GER disease Mother   . Ulcerative colitis Mother 3240  . Thyroid disease Mother   . Diabetes Father   . Thyroid disease Sister   . Bladder Cancer Paternal Grandfather 70       mets to colon, +smoker  . Skin cancer Maternal Aunt        lots of other maternal family members with skin cancer as well  . Breast cancer Neg Hx   . Ovarian cancer Neg Hx   . Heart disease Neg Hx     Social History Social History   Tobacco Use  .  Smoking status: Never Smoker  . Smokeless tobacco: Never Used  Vaping Use  . Vaping Use: Never used  Substance Use Topics  . Alcohol use: Yes    Comment: 3 q week- beer/wine  . Drug use: No    Review of Systems Constitutional: No fever/chills Eyes: No visual changes. ENT: No sore throat. Cardiovascular: + Syncope . denies chest pain. Respiratory: Denies shortness of breath. Gastrointestinal: At least one episode of vomiting during the syncopal episodes.  No abdominal pain.  Has some persistent nausea. Genitourinary: Negative for dysuria. Musculoskeletal: Negative for neck pain.  Negative for back pain. Integumentary: Bruising to right side of right foot, unknown trauma. Neurological: Some numbness at the site of the bruise on her right foot.  Otherwise negative for headaches, focal weakness or numbness.   ____________________________________________   PHYSICAL EXAM:  VITAL SIGNS: ED Triage Vitals  Enc Vitals Group     BP 02/10/20 2153 114/68     Pulse Rate 02/10/20 2153 63     Resp 02/10/20 2153 14     Temp --      Temp src --      SpO2 02/10/20 2153 100 %     Weight 02/10/20 2154 68 kg (150 lb)     Height 02/10/20 2154 1.626 m (5\' 4" )     Head Circumference --      Peak Flow --      Pain Score 02/10/20 2154 2     Pain Loc --      Pain Edu? --      Excl. in GC? --     Constitutional: Alert and oriented.  Eyes: Conjunctivae are normal.  Head: Atraumatic. Nose: No congestion/rhinnorhea. Mouth/Throat: Patient is wearing a mask. Neck: No stridor.  No meningeal signs.   Cardiovascular: Normal rate, regular rhythm. Good peripheral circulation. Grossly normal heart sounds. Respiratory: Normal respiratory effort.  No retractions. Gastrointestinal: Soft and nondistended Musculoskeletal: The patient has an area on the lateral aspect of her right foot just proximal to the little digit which extends and includes the right lateral side of the foot and extends to the bottom  of the foot.  This appears consistent with a subacute ecchymosis/contusion.  There is no streaking redness or suggestion of cellulitis.  The area is not particularly tender to palpation or manipulation.  There are no other gross deformities.  No petechiae.  Soft compartments.  No other abnormalities noted. Neurologic:  Normal speech and language. No gross focal neurologic deficits are appreciated.  Skin:  Skin is warm, dry and intact. Psychiatric: Mood and affect are normal. Speech and behavior are normal.  ____________________________________________   LABS (all labs ordered are listed, but only abnormal results are displayed)  Labs Reviewed - No data to display ____________________________________________  EKG  ED ECG REPORT I, Loleta Rose, the attending physician, personally viewed and interpreted this ECG.  Date: 02/10/2020 EKG Time: 21: 58 Rate: 59 Rhythm: Borderline sinus bradycardia QRS Axis: normal Intervals: normal ST/T Wave abnormalities: normal Narrative Interpretation: no evidence of acute ischemia  ____________________________________________  RADIOLOGY I, Loleta Rose, personally viewed and evaluated these images (plain radiographs) as part of my medical decision making, as well as reviewing the written report by the radiologist.  ED MD interpretation: No acute abnormalities identified on right foot x-rays  Official radiology report(s): DG Foot Complete Right  Result Date: 02/10/2020 CLINICAL DATA:  Swelling, ecchymosis, fell EXAM: RIGHT FOOT COMPLETE - 3+ VIEW COMPARISON:  None. FINDINGS: Frontal, oblique, and lateral views of the right foot are obtained. No fracture, subluxation, or dislocation. Joint spaces are well preserved. IMPRESSION: 1. Unremarkable right foot. Electronically Signed   By: Sharlet Salina M.D.   On: 02/10/2020 22:52    ____________________________________________   PROCEDURES   Procedure(s) performed (including Critical  Care):  .1-3 Lead EKG Interpretation Performed by: Loleta Rose, MD Authorized by: Loleta Rose, MD     Interpretation: normal     ECG rate:  60   ECG rate assessment: normal     Rhythm: sinus rhythm     Ectopy: none     Conduction: normal       ____________________________________________   INITIAL IMPRESSION / MDM / ASSESSMENT AND PLAN / ED COURSE  As part of my medical decision making, I reviewed the following data within the electronic MEDICAL RECORD NUMBER History obtained from family, Nursing notes reviewed and incorporated, Old chart reviewed, Radiograph reviewed  and Notes from prior ED visits   Differential diagnosis includes, but is not limited to, vasovagal syncope, contusion, fracture, electrolyte or metabolic abnormality, acute infection, less likely intracranial hemorrhage or CVA or PE.  The patient was on the cardiac monitor to evaluate for evidence of arrhythmia and/or significant heart rate changes.  The patient has had multiple similar episodes in the past.  The episode of syncope today was worse because she did not recovers quickly and when she would try to get back up again immediately after the event she would have another similar episode.  However she has been in the emergency department for 2 hours and feels essentially asymptomatic other than some residual nausea.  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  Particularly because she has a history of vasovagal episodes I am generally reassured by the presentation and current evaluation.  I discussed with her and her husband that we could perform additional evaluation work-up but that I do not believe that there is any emergent medical condition tonight given the acute onset of the episode after she saw the contusion on  her foot.  She understands and agrees with the plan for discharge and outpatient follow-up.  I will give her Zofran for milligram p.o. ODT tonight and she will follow up with one of her regular providers.  I gave my usual and customary return precautions.  Of note, there is no evidence of fractures to her foot and she was treated conservatively with ice as needed, elevation, and I will provide follow-up with podiatry if she would like to do so.         ____________________________________________  FINAL  CLINICAL IMPRESSION(S) / ED DIAGNOSES  Final diagnoses:  Contusion of right foot, initial encounter  Vasovagal syncope     MEDICATIONS GIVEN DURING THIS VISIT:  Medications  ondansetron (ZOFRAN-ODT) disintegrating tablet 4 mg (has no administration in time range)  ondansetron (ZOFRAN) injection 4 mg (4 mg Intravenous Given 02/10/20 2228)     ED Discharge Orders    None      *Please note:  Allee Busk was evaluated in Emergency Department on 02/10/2020 for the symptoms described in the history of present illness. She was evaluated in the context of the global COVID-19 pandemic, which necessitated consideration that the patient might be at risk for infection with the SARS-CoV-2 virus that causes COVID-19. Institutional protocols and algorithms that pertain to the evaluation of patients at risk for COVID-19 are in a state of rapid change based on information released by regulatory bodies including the CDC and federal and state organizations. These policies and algorithms were followed during the patient's care in the ED.  Some ED evaluations and interventions may be delayed as a result of limited staffing during and after the pandemic.*  Note:  This document was prepared using Dragon voice recognition software and may include unintentional dictation errors.   Loleta Rose, MD 02/10/20 [phone]

## 2020-02-10 NOTE — ED Notes (Addendum)
Pt given warm blanket. Bed locked low. Rail up. Call bell within reach. Pt now c/o neck pain. EDP Williams notified. Pt denies fall or specific mechanism of injury.

## 2020-02-10 NOTE — ED Notes (Signed)
Pt brought back to room by [employer]. Husband with pt.

## 2020-02-10 NOTE — ED Notes (Signed)
Pt's foot near 5th digit bruised. No swelling noted. Pt states her foot no longer looks swollen to her but was swollen while in the shower. Pt A&Ox4. Pt denies major medical history. States she passes out easily; anything "disgusting" makes her pass out. Denies pain. Reports eating at a new restaurant recently. Reports nausea.

## 2020-02-10 NOTE — Discharge Instructions (Signed)
Your workup in the Emergency Department today was reassuring.  We did not find any specific abnormalities.  We recommend you drink plenty of fluids, take your regular medications and/or any new ones prescribed today, and follow up with the doctor(s) listed in these documents as recommended.  Return to the Emergency Department if you develop new or worsening symptoms that concern you.  

## 2020-02-10 NOTE — ED Triage Notes (Signed)
Pt to the er for pain to the right foot. Pt said that her foot was swollen and purple and she saw it in the shower and had a vasovagal response when she walked to the bed. Pt has a small bruise to the lateral right 5th digit. Asked pt if this syncopal episode is like normal. Pt and husband state that she normally bounces back but today it seems to linger.

## 2020-05-17 ENCOUNTER — Encounter: Payer: BC Managed Care – PPO | Admitting: Family Medicine

## 2020-09-02 ENCOUNTER — Encounter: Payer: BC Managed Care – PPO | Admitting: Obstetrics and Gynecology

## 2020-09-03 ENCOUNTER — Encounter: Payer: BC Managed Care – PPO | Admitting: Obstetrics and Gynecology

## 2021-12-03 IMAGING — DX DG FOOT COMPLETE 3+V*R*
3 series · 3 of 3 positions shown · non-contrast
Comparison: None.

CLINICAL DATA: Swelling, ecchymosis, fell

EXAM:
RIGHT FOOT COMPLETE - 3+ VIEW

[foot ap]
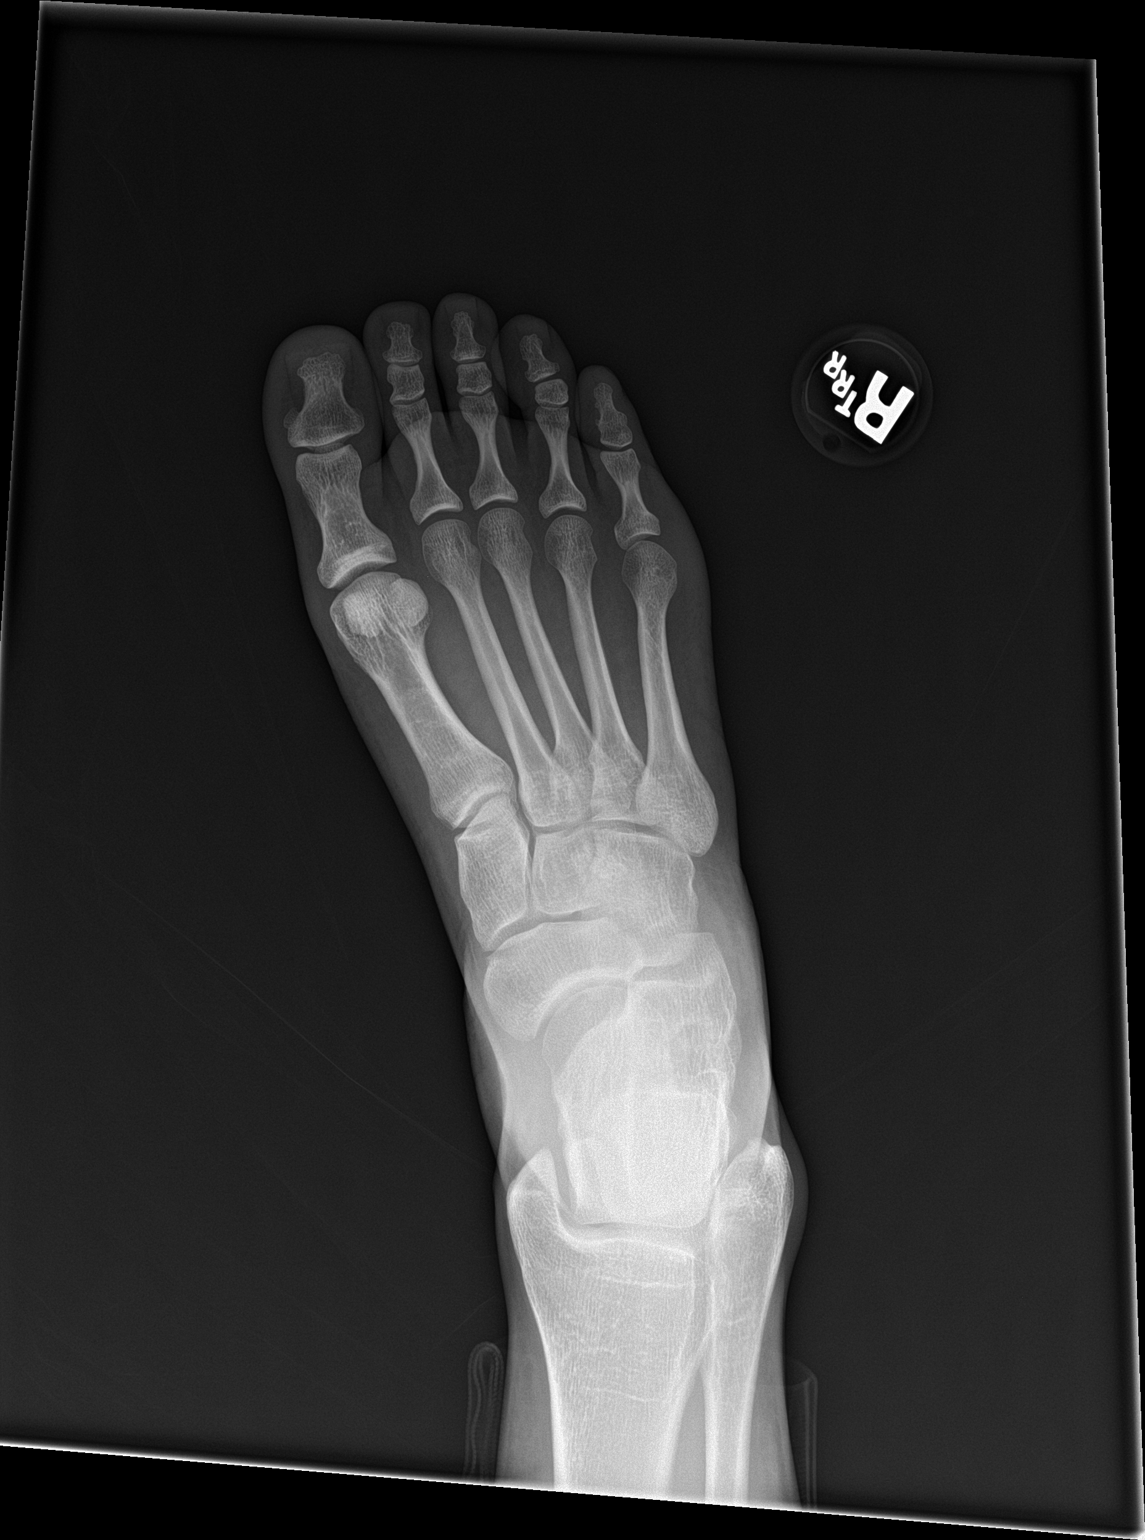

[foot obl]
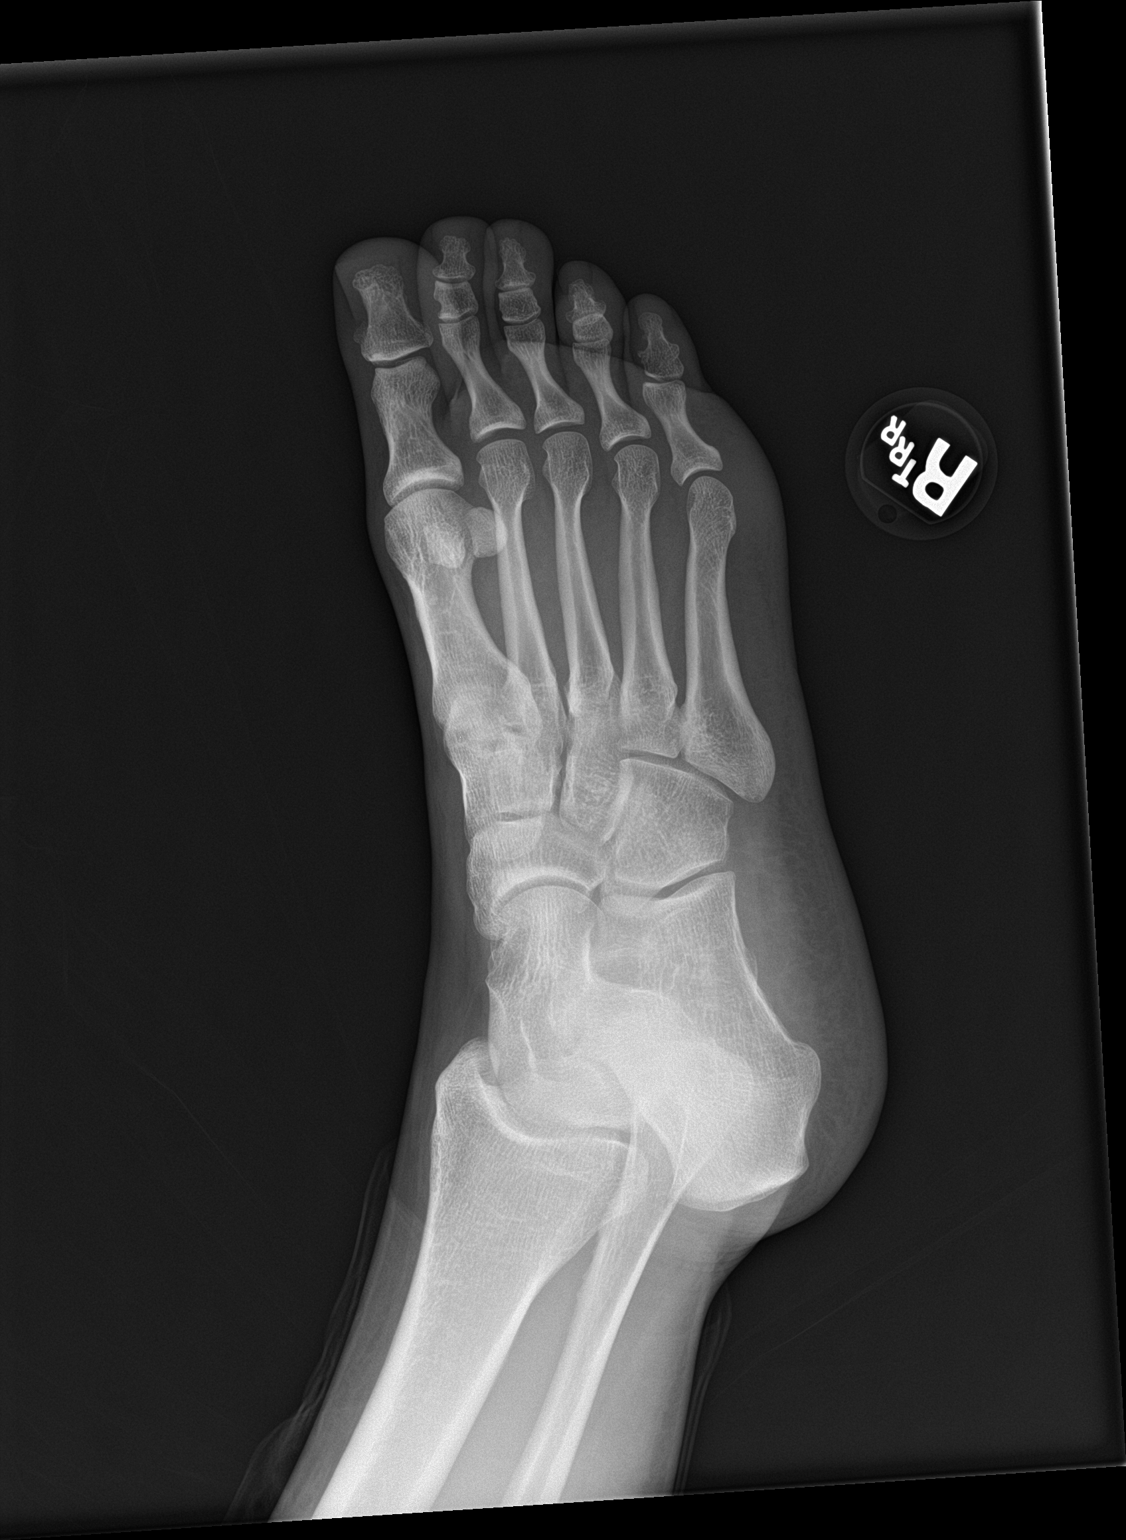

[foot lat]
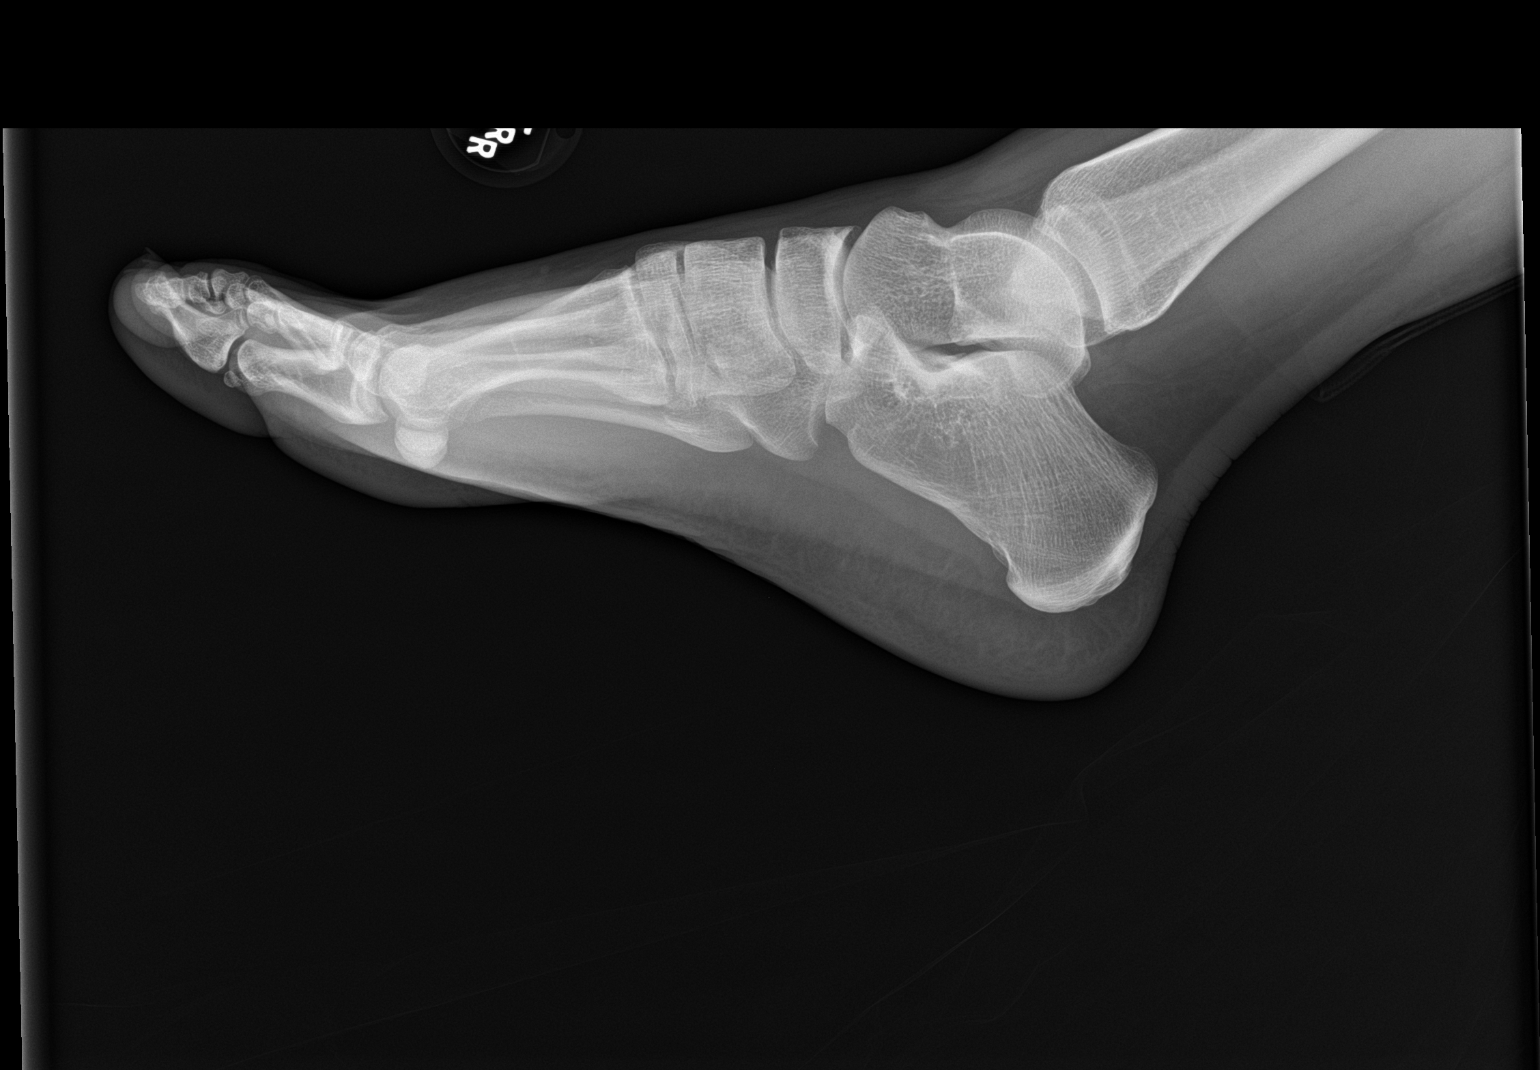

[3 of 3 positions shown; findings below may reference images not displayed]

FINDINGS: Frontal, oblique, and lateral views of the right foot are obtained.
No fracture, subluxation, or dislocation. Joint spaces are well
preserved.
IMPRESSION: 1. Unremarkable right foot.
